# Patient Record
Sex: Male | Born: 1975 | Race: White | Hispanic: No | Marital: Single | Smoking: Former smoker
Health system: Southern US, Community
[De-identification: ages and names within clinical notes are randomized; demographics above are authoritative.]

## PROBLEM LIST (undated history)

## (undated) DIAGNOSIS — F602 Antisocial personality disorder: Secondary | ICD-10-CM

## (undated) DIAGNOSIS — F1290 Cannabis use, unspecified, uncomplicated: Secondary | ICD-10-CM

## (undated) DIAGNOSIS — F4310 Post-traumatic stress disorder, unspecified: Secondary | ICD-10-CM

## (undated) HISTORY — PX: NO PAST SURGERIES: SHX2092

---

## 2016-09-28 ENCOUNTER — Encounter (HOSPITAL_COMMUNITY): Payer: Self-pay | Admitting: *Deleted

## 2016-09-28 DIAGNOSIS — L02415 Cutaneous abscess of right lower limb: Secondary | ICD-10-CM | POA: Insufficient documentation

## 2016-09-28 DIAGNOSIS — Z87891 Personal history of nicotine dependence: Secondary | ICD-10-CM | POA: Insufficient documentation

## 2016-09-28 NOTE — ED Triage Notes (Signed)
Pt c/o red, swollen painful area to back of right leg that started yesterday

## 2016-09-29 ENCOUNTER — Emergency Department (HOSPITAL_COMMUNITY)
Admission: EM | Admit: 2016-09-29 | Discharge: 2016-09-29 | Disposition: A | Discharge status: Home / Self Care | Payer: Self-pay | Attending: Emergency Medicine | Admitting: Emergency Medicine

## 2016-09-29 DIAGNOSIS — L0291 Cutaneous abscess, unspecified: Secondary | ICD-10-CM

## 2016-09-29 HISTORY — DX: Post-traumatic stress disorder, unspecified: F43.10

## 2016-09-29 MED ORDER — LIDOCAINE HCL (PF) 1 % IJ SOLN
INTRAMUSCULAR | Status: AC
  Administered 2016-09-29: 02:00:00
  Filled 2016-09-29: qty 5

## 2016-09-29 MED ORDER — SULFAMETHOXAZOLE-TRIMETHOPRIM 800-160 MG PO TABS: 1.0000 | ORAL_TABLET | Freq: Two times a day (BID) | ORAL | 0 refills | Status: AC

## 2016-09-29 MED ORDER — SULFAMETHOXAZOLE-TRIMETHOPRIM 800-160 MG PO TABS
1.0000 | ORAL_TABLET | Freq: Once | ORAL | Status: AC
  Administered 2016-09-29: 1 via ORAL
  Filled 2016-09-29: qty 1

## 2016-09-29 NOTE — ED Provider Notes (Signed)
[facility]-EM[facility]ENCY DEPT Provider Note   CSN: [id number] Arrival date & time: 09/28/16  2305     History   Chief Complaint Chief Complaint  Patient presents with  . Abscess    HPI Layla MawMichael J Careaga is a 40 y.o. male.  HPI   Layla MawMichael J Worley is a 40 y.o. male who presents to the Emergency Department complaining of painful, red, swollen area to his right inner thigh for one day.  He reports hx of boils and states pain feels similar to previous.  He has tried squeezing the area and war soaks without relief.  Pain is worse with walking.  He denies fever, chills, pain to his groin, abdomen or testicle.     Past Medical History:  Diagnosis Date  . PTSD (post-traumatic stress disorder)     There are no active problems to display for this patient.   History reviewed. No pertinent surgical history.     Home Medications    Prior to Admission medications   Not on File    Family History History reviewed. No pertinent family history.  Social History Social History  Substance Use Topics  . Smoking status: Former Games developermoker  . Smokeless tobacco: Never Used  . Alcohol use No     Allergies   Penicillins   Review of Systems Review of Systems  Constitutional: Negative for chills and fever.  Gastrointestinal: Negative for abdominal pain, nausea and vomiting.  Genitourinary: Negative for dysuria, penile pain, scrotal swelling and testicular pain.  Musculoskeletal: Negative for arthralgias and joint swelling.  Skin: Positive for color change.       Abscess   Hematological: Negative for adenopathy.  All other systems reviewed and are negative.    Physical Exam Updated Vital Signs BP 135/70 (BP Location: Left Arm)   Pulse 62   Temp 98.1 F (36.7 C) (Oral)   Resp 18   Ht 6' (1.829 m)   Wt 127 kg   SpO2 94%   BMI 37.97 kg/m   Physical Exam  Constitutional: He is oriented to person, place, and time. He appears well-developed and well-nourished. No distress.  HENT:    Head: Normocephalic and atraumatic.  Cardiovascular: Normal rate, regular rhythm and normal heart sounds.   No murmur heard. Pulmonary/Chest: Effort normal and breath sounds normal. No respiratory distress.  Abdominal: Soft. He exhibits no distension. There is no tenderness.  Musculoskeletal: Normal range of motion.  Neurological: He is alert and oriented to person, place, and time. He exhibits normal muscle tone. Coordination normal.  Skin: Skin is warm and dry. There is erythema.  Focal tenderness, erythema to the right medial upper thigh with 3 cm area of fluctuance.  No drainage.   Nursing note and vitals reviewed.    ED Treatments / Results  Labs (all labs ordered are listed, but only abnormal results are displayed) Labs Reviewed - No data to display  EKG  EKG Interpretation None       Radiology No results found.  Procedures Procedures (including critical care time)  INCISION AND DRAINAGE Performed by: Maxwell CaulRIPLETT,Zephyr Ridley L. Consent: Verbal consent obtained. Risks and benefits: risks, benefits and alternatives were discussed Type: abscess  Body area: right upper thigh  Anesthesia: local infiltration  Incision was made with a #11 scalpel.  Local anesthetic: lidocaine 1 % w/o epinephrine  Anesthetic total: 3 ml  Complexity: complex Blunt dissection to break up loculations  Drainage: purulent  Drainage amount: moderate  Packing material: 1/4 in iodoform gauze  Patient tolerance: Patient  tolerated the procedure well with no immediate complications.    Medications Ordered in ED Medications  lidocaine (PF) (XYLOCAINE) 1 % injection (not administered)     Initial Impression / Assessment and Plan / ED Course  I have reviewed the triage vital signs and the nursing notes.  Pertinent labs & imaging results that were available during my care of the patient were reviewed by me and considered in my medical decision making (see chart for details).  Clinical  Course     pt with recurrent abscess.  Mild to moderate surrounding cellulitis. NV intact.  Rx for bactrim, pt agrees to warm soaks, ER return if needed.      Final Clinical Impressions(s) / ED Diagnoses   Final diagnoses:  Abscess    New Prescriptions New Prescriptions   SULFAMETHOXAZOLE-TRIMETHOPRIM (BACTRIM DS,SEPTRA DS) 800-160 MG TABLET    Take 1 tablet by mouth 2 (two) times daily.     Pauline Ausammy Brigetta Beckstrom, PA-C 09/29/16 [id number]    Devoria AlbeIva Knapp, MD 09/29/16 [phone]

## 2016-09-29 NOTE — Discharge Instructions (Signed)
Keep the area bandaged.  Packing will need to be removed in 2 days.  Warm water soaks 2-3 times a day until the area heals.  Return here for any worsening symptoms

## 2016-10-05 ENCOUNTER — Emergency Department (HOSPITAL_COMMUNITY)
Admission: EM | Admit: 2016-10-05 | Discharge: 2016-10-05 | Disposition: A | Discharge status: Home / Self Care | Payer: Self-pay | Attending: Emergency Medicine | Admitting: Emergency Medicine

## 2016-10-05 ENCOUNTER — Encounter (HOSPITAL_COMMUNITY): Payer: Self-pay | Admitting: Emergency Medicine

## 2016-10-05 DIAGNOSIS — Z4800 Encounter for change or removal of nonsurgical wound dressing: Secondary | ICD-10-CM | POA: Insufficient documentation

## 2016-10-05 DIAGNOSIS — Z5189 Encounter for other specified aftercare: Secondary | ICD-10-CM

## 2016-10-05 DIAGNOSIS — Z87891 Personal history of nicotine dependence: Secondary | ICD-10-CM | POA: Insufficient documentation

## 2016-10-05 NOTE — ED Notes (Signed)
Pt seen by edp and d/c prior to nursing assesesment.

## 2016-10-05 NOTE — ED Triage Notes (Signed)
Pt reports he has packing stuck in an area on his leg that was packed on the 6th.

## 2016-10-05 NOTE — ED Provider Notes (Signed)
[facility] DEPT Provider Note   CSN: [id number] Arrival date & time: 10/05/16  1325 By signing my name below, I, Bridgette HabermannMaria Tan, attest that this documentation has been prepared under the direction and in the presence of Raeford RazorStephen Raveena Hebdon, MD. Electronically Signed: Bridgette HabermannMaria Tan, ED Scribe. 10/05/16. 2:39 PM.  History   Chief Complaint Chief Complaint  Patient presents with  . Wound Check   HPI Comments: Reginald Thompson is a 40 y.o. male with no pertinent PMHx, who presents to the Emergency Department for a wound check. Pt was seen here on 09/29/16 for an abscess to his right inner thigh. Pt had an I&D done and packing placed. He was prescribed Bactrim and subsequently discharged. Pt has since been compliant with his medication; however, he is concerned that packing is still stuck inside the now closed I&D site.  He denies fever, chills, nausea, vomiting, or any other associated symptoms. Pt is in no pain at this time.   The history is provided by the patient. No language interpreter was used.    Past Medical History:  Diagnosis Date  . PTSD (post-traumatic stress disorder)     There are no active problems to display for this patient.   History reviewed. No pertinent surgical history.     Home Medications    Prior to Admission medications   Medication Sig Start Date End Date Taking? Authorizing Provider  sulfamethoxazole-trimethoprim (BACTRIM DS,SEPTRA DS) 800-160 MG tablet Take 1 tablet by mouth 2 (two) times daily. 09/29/16 10/06/16  Pauline Ausammy Triplett, PA-C    Family History Family History  Problem Relation Age of Onset  . Hypertension Mother   . Diabetes Other   . Cancer Other     Social History Social History  Substance Use Topics  . Smoking status: Former Games developermoker  . Smokeless tobacco: Never Used  . Alcohol use No     Allergies   Penicillins   Review of Systems Review of Systems  Constitutional: Negative for chills and fever.  Gastrointestinal: Negative for  nausea and vomiting.  Skin: Positive for wound.     Physical Exam Updated Vital Signs BP 131/80 (BP Location: Left Arm)   Pulse (!) 55   Temp 97.9 F (36.6 C) (Oral)   Resp 18   Ht 6' (1.829 m)   Wt 280 lb (127 kg)   SpO2 98%   BMI 37.97 kg/m   Physical Exam  Constitutional: He appears well-developed and well-nourished.  HENT:  Head: Normocephalic.  Eyes: Conjunctivae are normal.  Cardiovascular: Normal rate.   Pulmonary/Chest: Effort normal. No respiratory distress.  Abdominal: He exhibits no distension.  Musculoskeletal: Normal range of motion.  Small wound to right medial thigh consistent with recent I&D. No tenderness, no redness, no swelling. Highly doubt packing still remains.  Neurological: He is alert.  Skin: Skin is warm and dry.  Psychiatric: He has a normal mood and affect. His behavior is normal.  Nursing note and vitals reviewed.    ED Treatments / Results  DIAGNOSTIC STUDIES: Oxygen Saturation is 98% on RA, normal by my interpretation.    COORDINATION OF CARE: 2:37 PM Discussed treatment plan with pt at bedside and pt agreed to plan.  Labs (all labs ordered are listed, but only abnormal results are displayed) Labs Reviewed - No data to display  EKG  EKG Interpretation None       Radiology No results found.  Procedures Procedures (including critical care time)  Medications Ordered in ED Medications - No data to display  Initial Impression / Assessment and Plan / ED Course  I have reviewed the triage vital signs and the nursing notes.  Pertinent labs & imaging results that were available during my care of the patient were reviewed by me and considered in my medical decision making (see chart for details).  Clinical Course    40 year old male presenting for wound 37check. He has concern that packing may still be in place. I highly doubt this. Skin is now closed. Do not appreciate a foreign body. The site overall appears to be healing  without obvious complication. I suspect he would have a lot of inflammation  changes he did have packing still in place. Continued wound care and further return precautions for discussed.  Final Clinical Impressions(s) / ED Diagnoses   Final diagnoses:  Visit for wound check    New Prescriptions New Prescriptions   No medications on file   I personally preformed the services scribed in my presence. The recorded information has been reviewed is accurate. Raeford RazorStephen Wells Gerdeman, MD.     Raeford RazorStephen Daimen Shovlin, MD 10/12/16 [phone]

## 2016-10-23 NOTE — Congregational Nurse Program (Signed)
Congregational Nurse Program Note  Date of Encounter: 10/23/2016  Past Medical History: Past Medical History:  Diagnosis Date  . PTSD (post-traumatic stress disorder)     Encounter Details:     CNP Questionnaire - 10/20/16 1413      Patient Demographics   Is this a new or existing patient? New   Patient is considered a/an Not Applicable   Race Caucasian/White     Patient Assistance   [place] of Patient [place]   Patient's financial/insurance status Self-Pay (Uninsured)   Uninsured Patient ([employer] Connects) Yes   Interventions Assisted patient in making appt.   Patient referred to apply for the following financial assistance Not Applicable   Food insecurities addressed Provided food supplies   Transportation assistance No   Assistance securing medications No   Educational health offerings Navigating the healthcare system     Encounter Details   Primary purpose of visit Navigating the Healthcare System   Was an Emergency Department visit averted? No   Does patient have a medical provider? No   Patient referred to Area Agency   Was a mental health screening completed? (GAINS tool) No   Does patient have dental issues? No   Does patient have vision issues? No   Does your patient have an abnormal blood pressure today? No   Since previous encounter, have you referred patient for abnormal blood pressure that resulted in a new diagnosis or medication change? No   Does your patient have an abnormal blood glucose today? No   Since previous encounter, have you referred patient for abnormal blood glucose that resulted in a new diagnosis or medication change? No   Was there a life-saving intervention made? No     [facility] in need of a healthcare provider. Client denies any physical conditions but does state suffers from PTSD, and understands in need of medical treatment for this and to get established with a PCP.Client  was offered information of process to seek help at [facility]. And was also given [facility]. Appointment was completed with [facility] health department 11/02/16 and notified of appointment, has transportion. Vitals today BP- 122/64 P-54.  Leotis ShamesJanice Nehal Shives,RN [phone].

## 2017-01-27 ENCOUNTER — Emergency Department (HOSPITAL_COMMUNITY)
Admission: EM | Admit: 2017-01-27 | Discharge: 2017-01-28 | Disposition: A | Discharge status: Home / Self Care | Payer: Self-pay | Attending: Emergency Medicine | Admitting: Emergency Medicine

## 2017-01-27 ENCOUNTER — Encounter (HOSPITAL_COMMUNITY): Payer: Self-pay | Admitting: Emergency Medicine

## 2017-01-27 DIAGNOSIS — L02416 Cutaneous abscess of left lower limb: Secondary | ICD-10-CM | POA: Insufficient documentation

## 2017-01-27 DIAGNOSIS — Z87891 Personal history of nicotine dependence: Secondary | ICD-10-CM | POA: Insufficient documentation

## 2017-01-27 MED ORDER — MORPHINE SULFATE (PF) 4 MG/ML IV SOLN
4.0000 mg | Freq: Once | INTRAVENOUS | Status: AC
  Administered 2017-01-28: 4 mg via INTRAMUSCULAR
  Filled 2017-01-27: qty 1

## 2017-01-27 MED ORDER — LIDOCAINE-EPINEPHRINE (PF) 2 %-1:200000 IJ SOLN
10.0000 mL | Freq: Once | INTRAMUSCULAR | Status: AC
  Administered 2017-01-28: 10 mL
  Filled 2017-01-27: qty 20

## 2017-01-27 NOTE — ED Provider Notes (Signed)
[facility]-EMERGENCY DEPT Provider Note   CSN: [id number] Arrival date & time: 01/27/17  2048  By signing my name below, I, Cynda Acres, attest that this documentation has been prepared under the direction and in the presence of Devoria Albe, MD. Electronically Signed: Cynda Acres, Scribe. 01/27/17. 11:42 PM.  Time seen 23:30 PM  History   Chief Complaint Chief Complaint  Patient presents with  . Abscess   HPI Comments: Reginald Thompson is a 41 y.o. male with a history of PTSD and recurrent abscesses, who presents to the Emergency Department complaining of sudden-onset, constant worsening pain and swelling to the right inner thigh that began 4 days ago. Patient reports recurrent "boils" to the right inner thigh. Patient was seen here 09/29/16, in which the patient had an abscess in the same area, an I&D procedure was done and antibiotics were prescribed. Patient reports associated subjective fever. No modifying factors indicated. Patient denies having a primary care physician. Patient denies any social history of tobacco or alcohol use. Patient denies any nausea, vomiting, diarrhea, drainage, documented fever, chills, numbness, or tingling.   The history is provided by the patient. No language interpreter was used.   PCP none  Past Medical History:  Diagnosis Date  . PTSD (post-traumatic stress disorder)     There are no active problems to display for this patient.   History reviewed. No pertinent surgical history.     Home Medications    Prior to Admission medications   Medication Sig Start Date End Date Taking? Authorizing Provider  HYDROcodone-acetaminophen (NORCO/VICODIN) 5-325 MG tablet Take 1 tablet by mouth every 6 (six) hours as needed. 01/28/17   Devoria Albe, MD  sulfamethoxazole-trimethoprim (BACTRIM DS,SEPTRA DS) 800-160 MG tablet Take 1 tablet by mouth 2 (two) times daily. 01/28/17   Devoria Albe, MD    Family History Family History  Problem Relation Age of Onset  .  Hypertension Mother   . Diabetes Other   . Cancer Other     Social History Social History  Substance Use Topics  . Smoking status: Former Games developer  . Smokeless tobacco: Never Used  . Alcohol use No  unemployed Applying for disabililty for PTSD and back pain   Allergies   Penicillins   Review of Systems Review of Systems  Constitutional: Positive for fever (subjective).  Gastrointestinal: Negative for diarrhea, nausea and vomiting.  Skin:       Abscess to the right posterior thigh, no drainage.   Neurological: Negative for numbness.  All other systems reviewed and are negative.    Physical Exam Updated Vital Signs BP 120/76 (BP Location: Left Arm)   Pulse 79   Temp 98.6 F (37 C) (Oral)   Resp 18   Ht 6' (1.829 m)   Wt 280 lb (127 kg)   SpO2 98%   BMI 37.97 kg/m   Vital signs normal    Physical Exam  Constitutional: He is oriented to person, place, and time. He appears well-developed and well-nourished.  Non-toxic appearance. He does not appear ill. He appears distressed.  HENT:  Head: Normocephalic and atraumatic.  Right Ear: External ear normal.  Left Ear: External ear normal.  Nose: Nose normal.  Mouth/Throat: Oropharynx is clear and moist and mucous membranes are normal.  Eyes: Conjunctivae and EOM are normal.  Neck: Normal range of motion and full passive range of motion without pain. Neck supple.  Cardiovascular: Normal rate.   Pulmonary/Chest: Effort normal. No respiratory distress. He has no rhonchi. He exhibits no  crepitus.  Abdominal: Normal appearance.  Musculoskeletal: Normal range of motion. He exhibits no edema or tenderness.  2x 3 cm swelling and raised area of abscess with 5cm area of surrounding erythema to the right proximal inner thigh.   Neurological: He is alert and oriented to person, place, and time. He has normal strength. No cranial nerve deficit.  Skin: Skin is warm, dry and intact. No rash noted. There is erythema. No pallor.    Psychiatric: He has a normal mood and affect. His speech is normal and behavior is normal. His mood appears not anxious.  Nursing note and vitals reviewed.    ED Treatments / Results  Labs (all labs ordered are listed, but only abnormal results are displayed) Labs Reviewed - No data to display  EKG  EKG Interpretation None       Radiology No results found.  Procedures .Marland KitchenIncision and Drainage Date/Time: 01/28/2017 12:55 AM Performed by: Lynelle Doctor, Lajoyce Tamura Authorized by: Devoria Albe   Consent:    Consent obtained:  Verbal   Consent given by:  Patient   Risks discussed:  Bleeding and pain   Alternatives discussed:  No treatment Location:    Type:  Abscess   Size:  2x3 cm   Location:  Lower extremity   Lower extremity location:  Leg   Leg location:  R upper leg Pre-procedure details:    Skin preparation:  Betadine Anesthesia (see MAR for exact dosages):    Anesthesia method:  Local infiltration   Local anesthetic:  Lidocaine 2% WITH epi Procedure type:    Complexity:  Complex Procedure details:    Needle aspiration: no     Incision types:  Single straight   Incision depth:  Subcutaneous   Scalpel blade:  11   Wound management:  Probed and deloculated   Drainage:  Purulent and bloody   Drainage amount:  Copious   Wound treatment:  Wound left open   Packing materials:  1/4 in gauze   Amount 1/4":  3 Post-procedure details:    Patient tolerance of procedure:  Tolerated well, no immediate complications Comments:     Pt given Morphine 4 mg IM prior to procedure   (including critical care time)  Medications Ordered in ED Medications  sulfamethoxazole-trimethoprim (BACTRIM DS,SEPTRA DS) 800-160 MG per tablet 1 tablet (not administered)  morphine 4 MG/ML injection 4 mg (4 mg Intramuscular Given 01/28/17 0006)  lidocaine-EPINEPHrine (XYLOCAINE W/EPI) 2 %-1:200000 (PF) injection 10 mL (10 mLs Infiltration Given by Other 01/28/17 0019)     Initial Impression / Assessment and  Plan / ED Course  I have reviewed the triage vital signs and the nursing notes.  Pertinent labs & imaging results that were available during my care of the patient were reviewed by me and considered in my medical decision making (see chart for details).    DIAGNOSTIC STUDIES: Oxygen Saturation is 98% on RA, normal by my interpretation.    COORDINATION OF CARE: 11:41 PM Discussed treatment plan with pt at bedside and pt agreed to plan, which includes and I&D.      Final Clinical Impressions(s) / ED Diagnoses   Final diagnoses:  Abscess of left thigh    New Prescriptions New Prescriptions   HYDROCODONE-ACETAMINOPHEN (NORCO/VICODIN) 5-325 MG TABLET    Take 1 tablet by mouth every 6 (six) hours as needed.   SULFAMETHOXAZOLE-TRIMETHOPRIM (BACTRIM DS,SEPTRA DS) 800-160 MG TABLET    Take 1 tablet by mouth 2 (two) times daily.     Plan discharge  Devoria Albe, MD, FACEP  I personally performed the services described in this documentation, which was scribed in my presence. The recorded information has been reviewed and considered.  Devoria Albe, MD, Concha Pyo, MD 01/28/17 [phone]

## 2017-01-27 NOTE — ED Triage Notes (Signed)
Pt states he has a boil in right groin area for 4 days

## 2017-01-27 NOTE — Congregational Nurse Program (Signed)
Congregational Nurse Program Note  Date of Encounter: 01/20/2017  Past Medical History: Past Medical History:  Diagnosis Date  . PTSD (post-traumatic stress disorder)     Encounter Details:     CNP Questionnaire - 01/20/17 1038      Patient Demographics   Is this a new or existing patient? New   Patient is considered a/an Not Applicable   Race Caucasian/White     Patient Assistance   Location of Patient [place] [facility]   Patient's financial/insurance status Self-Pay (Uninsured)   Uninsured Patient ([employer] officer, trade union) Yes   Patient referred to apply for the following financial assistance Not Applicable   Food insecurities addressed Provided food supplies   Transportation assistance No   Assistance securing medications No   Educational Programmer, systems the healthcare system     Encounter Details   Primary purpose of visit Navigating the Healthcare System   Was an Emergency Department visit averted? No   Does patient have a medical provider? No   Patient referred to Area Agency   Was a mental health screening completed? (GAINS tool) No   Does patient have dental issues? No   Does patient have vision issues? No   Does your patient have an abnormal blood pressure today? No   Since previous encounter, have you referred patient for abnormal blood pressure that resulted in a new diagnosis or medication change? No   Does your patient have an abnormal blood glucose today? No   Since previous encounter, have you referred patient for abnormal blood glucose that resulted in a new diagnosis or medication change? No   Was there a life-saving intervention made? No     Referral for follow-up appointment to [facility] 02/11/17 11:00. Transportation arranged with [facility].  Client notified of appointment.  Hewitt Shorts, RN  [phone].

## 2017-01-28 MED ORDER — DOXYCYCLINE HYCLATE 100 MG PO TABS: 100.0000 mg | ORAL_TABLET | Freq: Once | ORAL | Status: DC

## 2017-01-28 MED ORDER — HYDROCODONE-ACETAMINOPHEN 5-325 MG PO TABS: 1.0000 | ORAL_TABLET | Freq: Four times a day (QID) | ORAL | 0 refills | Status: DC | PRN

## 2017-01-28 MED ORDER — SULFAMETHOXAZOLE-TRIMETHOPRIM 800-160 MG PO TABS
1.0000 | ORAL_TABLET | Freq: Once | ORAL | Status: AC
  Administered 2017-01-28: 1 via ORAL
  Filled 2017-01-28: qty 1

## 2017-01-28 MED ORDER — SULFAMETHOXAZOLE-TRIMETHOPRIM 800-160 MG PO TABS: 1.0000 | ORAL_TABLET | Freq: Two times a day (BID) | ORAL | 0 refills | Status: DC

## 2017-01-28 NOTE — Discharge Instructions (Signed)
Soak in a tub of warm epsom salts for 30 minutes 3 times a day. You can pull out the packing while soaking in a warm tub in about 2 days. Take the antibiotic until gone. You can take the hydrocodone/acetaminophen for pain with ibuprofen 600 mg 4 times a day for pain.   Recheck if the redness and swelling enlarge, you get a high fever, vomiting or seem worse.

## 2017-01-28 NOTE — ED Notes (Signed)
Pt had an abscess on right thigh moderate amount of foul smelling greenish gray puss came out. Pt tolerated the I and D fairly well.

## 2017-02-02 MED FILL — Hydrocodone-Acetaminophen Tab 5-325 MG: ORAL | Qty: 6 | Status: AC

## 2017-04-28 NOTE — Congregational Nurse Program (Signed)
Congregational Nurse Program Note  Date of Encounter: 04/28/2017  Past Medical History: Past Medical History:  Diagnosis Date  . PTSD (post-traumatic stress disorder)     Encounter Details:     CNP Questionnaire - 04/28/17 1140      Patient Demographics   Is this a new or existing patient? Existing   Patient is considered a/an Not Applicable   Race Caucasian/White     Patient Assistance   Location of Patient [place]   Patient's financial/insurance status Self-Pay (Uninsured)   Uninsured Patient ([employer] Card/Care Connects) No   Patient referred to apply for the following financial assistance Not Applicable   Food insecurities addressed Provided food supplies   Transportation assistance No   Assistance securing medications No   Product/process development scientistducational health offerings Navigating the healthcare system;Other     Encounter Details   Primary purpose of visit Navigating the Healthcare System;Spiritual Care/Support Visit   Was an Emergency Department visit averted? Not Applicable   Does patient have a medical provider? Yes   Patient referred to Other   Was a mental health screening completed? (GAINS tool) No   Does patient have dental issues? No   Does patient have vision issues? No   Does your patient have an abnormal blood pressure today? No   Since previous encounter, have you referred patient for abnormal blood pressure that resulted in a new diagnosis or medication change? No   Does your patient have an abnormal blood glucose today? No   Since previous encounter, have you referred patient for abnormal blood glucose that resulted in a new diagnosis or medication change? No   Was there a life-saving intervention made? No    Client was given information to [facility] with [facility] to seek help with mental health issues. [facility], but isn't complying with following up with Mental Health. Client  states diagnosed with PTSD/Anxiety issue. Informed client importance of seeking help and following through with treatment.  Client was given number for Emergency/Crisis Services through [facility].  Pearletha AlfredJan Vincen Bejar,RN  [phone].

## 2019-10-06 ENCOUNTER — Encounter (HOSPITAL_COMMUNITY): Payer: Self-pay | Admitting: Emergency Medicine

## 2019-10-06 ENCOUNTER — Emergency Department (HOSPITAL_COMMUNITY)
Admission: EM | Admit: 2019-10-06 | Discharge: 2019-10-06 | Disposition: A | Discharge status: Home / Self Care | Payer: No Typology Code available for payment source | Attending: Emergency Medicine | Admitting: Emergency Medicine

## 2019-10-06 ENCOUNTER — Emergency Department (HOSPITAL_COMMUNITY): Payer: No Typology Code available for payment source

## 2019-10-06 ENCOUNTER — Other Ambulatory Visit: Payer: Self-pay

## 2019-10-06 DIAGNOSIS — M79671 Pain in right foot: Secondary | ICD-10-CM | POA: Diagnosis not present

## 2019-10-06 DIAGNOSIS — Y999 Unspecified external cause status: Secondary | ICD-10-CM | POA: Insufficient documentation

## 2019-10-06 DIAGNOSIS — Z79899 Other long term (current) drug therapy: Secondary | ICD-10-CM | POA: Diagnosis not present

## 2019-10-06 DIAGNOSIS — Y93I9 Activity, other involving external motion: Secondary | ICD-10-CM | POA: Insufficient documentation

## 2019-10-06 DIAGNOSIS — M25561 Pain in right knee: Secondary | ICD-10-CM | POA: Diagnosis not present

## 2019-10-06 DIAGNOSIS — Z87891 Personal history of nicotine dependence: Secondary | ICD-10-CM | POA: Diagnosis not present

## 2019-10-06 DIAGNOSIS — Y92410 Unspecified street and highway as the place of occurrence of the external cause: Secondary | ICD-10-CM | POA: Insufficient documentation

## 2019-10-06 DIAGNOSIS — M25571 Pain in right ankle and joints of right foot: Secondary | ICD-10-CM | POA: Insufficient documentation

## 2019-10-06 MED ORDER — HYDROCODONE-ACETAMINOPHEN 5-325 MG PO TABS
1.0000 | ORAL_TABLET | Freq: Once | ORAL | Status: AC
  Administered 2019-10-06: 14:00:00 1 via ORAL
  Filled 2019-10-06: qty 1

## 2019-10-06 MED ORDER — METHOCARBAMOL 500 MG PO TABS: 500.0000 mg | ORAL_TABLET | Freq: Two times a day (BID) | ORAL | 0 refills | Status: DC

## 2019-10-06 MED ORDER — MELOXICAM 7.5 MG PO TABS: 7.5000 mg | ORAL_TABLET | Freq: Every day | ORAL | 0 refills | Status: DC

## 2019-10-06 NOTE — ED Notes (Signed)
Patient transported to X-ray 
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
## 2019-10-06 NOTE — Discharge Instructions (Signed)
As discussed, your x-rays were normal with no broken bones. I would use the crutches and boot as needed. I have included a paper on ice therapy. I am sending you home with Robaxin which is a muscle relaxer. It can cause drowsiness, so do not drive or operate machinery while on the medication. I am also sending you home with Meloxicam which is for pain. Do not mix with other over the counter medications. Follow-up with your PCP if you symptoms do not improve within the next week. Return to the ER for new or worsening symptoms.
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
## 2020-09-25 ENCOUNTER — Other Ambulatory Visit: Payer: Self-pay

## 2020-09-25 ENCOUNTER — Encounter (HOSPITAL_COMMUNITY): Payer: Self-pay

## 2020-09-25 ENCOUNTER — Emergency Department (HOSPITAL_COMMUNITY): Payer: No Typology Code available for payment source

## 2020-09-25 ENCOUNTER — Emergency Department (HOSPITAL_COMMUNITY)
Admission: EM | Admit: 2020-09-25 | Discharge: 2020-09-25 | Disposition: A | Discharge status: Home / Self Care | Payer: No Typology Code available for payment source | Attending: Emergency Medicine | Admitting: Emergency Medicine

## 2020-09-25 DIAGNOSIS — Z23 Encounter for immunization: Secondary | ICD-10-CM | POA: Diagnosis not present

## 2020-09-25 DIAGNOSIS — Z87891 Personal history of nicotine dependence: Secondary | ICD-10-CM | POA: Insufficient documentation

## 2020-09-25 DIAGNOSIS — S20211A Contusion of right front wall of thorax, initial encounter: Secondary | ICD-10-CM | POA: Diagnosis not present

## 2020-09-25 DIAGNOSIS — Y92410 Unspecified street and highway as the place of occurrence of the external cause: Secondary | ICD-10-CM | POA: Diagnosis not present

## 2020-09-25 DIAGNOSIS — S6991XA Unspecified injury of right wrist, hand and finger(s), initial encounter: Secondary | ICD-10-CM | POA: Diagnosis present

## 2020-09-25 DIAGNOSIS — S62245A Nondisplaced fracture of shaft of first metacarpal bone, left hand, initial encounter for closed fracture: Secondary | ICD-10-CM | POA: Diagnosis not present

## 2020-09-25 DIAGNOSIS — S62244A Nondisplaced fracture of shaft of first metacarpal bone, right hand, initial encounter for closed fracture: Secondary | ICD-10-CM

## 2020-09-25 MED ORDER — TETANUS-DIPHTH-ACELL PERTUSSIS 5-2.5-18.5 LF-MCG/0.5 IM SUSY
0.5000 mL | PREFILLED_SYRINGE | Freq: Once | INTRAMUSCULAR | Status: AC
  Administered 2020-09-25: 0.5 mL via INTRAMUSCULAR
  Filled 2020-09-25: qty 0.5

## 2020-09-25 MED ORDER — LIDOCAINE HCL URETHRAL/MUCOSAL 2 % EX GEL
1.0000 "application " | Freq: Once | CUTANEOUS | Status: DC
  Filled 2020-09-25: qty 10

## 2020-09-25 MED ORDER — HYDROCODONE-ACETAMINOPHEN 5-325 MG PO TABS
1.0000 | ORAL_TABLET | Freq: Once | ORAL | Status: AC
Start: 2020-09-25 — End: 2020-09-25
  Administered 2020-09-25: 1 via ORAL
  Filled 2020-09-25: qty 1

## 2020-09-25 MED ORDER — IBUPROFEN 600 MG PO TABS: 600.0000 mg | ORAL_TABLET | Freq: Four times a day (QID) | ORAL | 0 refills | Status: DC | PRN

## 2020-09-25 MED ORDER — HYDROCODONE-ACETAMINOPHEN 5-325 MG PO TABS
1.0000 | ORAL_TABLET | ORAL | 0 refills | Status: DC | PRN
Start: 2020-09-25 — End: 2020-09-30

## 2020-09-25 MED ORDER — IBUPROFEN 800 MG PO TABS
800.0000 mg | ORAL_TABLET | Freq: Once | ORAL | Status: AC
  Administered 2020-09-25: 800 mg via ORAL
  Filled 2020-09-25: qty 1
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
## 2020-09-25 NOTE — ED Notes (Signed)
Pt gave his wallet to visitor in the room Myriam Jacobson).
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
## 2020-09-27 ENCOUNTER — Telehealth (HOSPITAL_COMMUNITY): Payer: Self-pay | Admitting: Emergency Medicine

## 2020-09-27 MED ORDER — HYDROCODONE-ACETAMINOPHEN 5-325 MG PO TABS
1.0000 | ORAL_TABLET | Freq: Four times a day (QID) | ORAL | 0 refills | Status: DC | PRN
Start: 2020-09-27 — End: 2020-09-30

## 2020-09-27 MED ORDER — IBUPROFEN 600 MG PO TABS: 600.0000 mg | ORAL_TABLET | Freq: Four times a day (QID) | ORAL | 0 refills | Status: DC | PRN

## 2020-09-27 NOTE — Telephone Encounter (Signed)
Prescription called into [facility] as [facility] refused to fill prescription for patient

## 2020-09-30 ENCOUNTER — Ambulatory Visit (INDEPENDENT_AMBULATORY_CARE_PROVIDER_SITE_OTHER): Payer: Self-pay | Admitting: Orthopedic Surgery

## 2020-09-30 ENCOUNTER — Other Ambulatory Visit: Payer: Self-pay

## 2020-09-30 ENCOUNTER — Encounter: Payer: Self-pay | Admitting: Orthopedic Surgery

## 2020-09-30 VITALS — BP 143/73 | HR 61 | Ht 72.0 in | Wt 304.0 lb

## 2020-09-30 DIAGNOSIS — S62231A Other displaced fracture of base of first metacarpal bone, right hand, initial encounter for closed fracture: Secondary | ICD-10-CM
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
## 2020-10-07 ENCOUNTER — Encounter (HOSPITAL_BASED_OUTPATIENT_CLINIC_OR_DEPARTMENT_OTHER): Payer: Self-pay | Admitting: Orthopedic Surgery

## 2020-10-07 ENCOUNTER — Other Ambulatory Visit: Payer: Self-pay

## 2020-10-07 ENCOUNTER — Other Ambulatory Visit: Payer: Self-pay | Admitting: Orthopedic Surgery

## 2020-10-09 ENCOUNTER — Other Ambulatory Visit (HOSPITAL_COMMUNITY)
Admission: RE | Admit: 2020-10-09 | Discharge: 2020-10-09 | Disposition: A | Discharge status: Home / Self Care | Payer: Self-pay | Source: Ambulatory Visit | Attending: Orthopedic Surgery | Admitting: Orthopedic Surgery

## 2020-10-09 DIAGNOSIS — Z20822 Contact with and (suspected) exposure to covid-19: Secondary | ICD-10-CM | POA: Insufficient documentation

## 2020-10-09 DIAGNOSIS — Z01812 Encounter for preprocedural laboratory examination: Secondary | ICD-10-CM | POA: Insufficient documentation

## 2020-10-09 LAB — SARS CORONAVIRUS 2 (TAT 6-24 HRS): SARS Coronavirus 2: NEGATIVE

## 2020-10-13 ENCOUNTER — Ambulatory Visit (HOSPITAL_BASED_OUTPATIENT_CLINIC_OR_DEPARTMENT_OTHER)
Admission: RE | Admit: 2020-10-13 | Discharge: 2020-10-13 | Disposition: A | Discharge status: Home / Self Care | Payer: No Typology Code available for payment source | Attending: Orthopedic Surgery | Admitting: Orthopedic Surgery

## 2020-10-13 ENCOUNTER — Encounter (HOSPITAL_BASED_OUTPATIENT_CLINIC_OR_DEPARTMENT_OTHER): Payer: Self-pay | Admitting: Orthopedic Surgery

## 2020-10-13 ENCOUNTER — Ambulatory Visit (HOSPITAL_BASED_OUTPATIENT_CLINIC_OR_DEPARTMENT_OTHER): Payer: No Typology Code available for payment source | Admitting: Anesthesiology

## 2020-10-13 ENCOUNTER — Encounter (HOSPITAL_BASED_OUTPATIENT_CLINIC_OR_DEPARTMENT_OTHER)
Admission: RE | Disposition: A | Discharge status: Home / Self Care | Payer: Self-pay | Source: Home / Self Care | Attending: Orthopedic Surgery

## 2020-10-13 ENCOUNTER — Other Ambulatory Visit: Payer: Self-pay

## 2020-10-13 DIAGNOSIS — Z87891 Personal history of nicotine dependence: Secondary | ICD-10-CM | POA: Diagnosis not present

## 2020-10-13 DIAGNOSIS — S62231A Other displaced fracture of base of first metacarpal bone, right hand, initial encounter for closed fracture: Secondary | ICD-10-CM | POA: Diagnosis not present

## 2020-10-13 HISTORY — PX: CLOSED REDUCTION METACARPAL WITH PERCUTANEOUS PINNING: SHX5613

## 2020-10-13 SURGERY — CLOSED REDUCTION, FRACTURE, METACARPAL BONE, WITH PERCUTANEOUS PINNING
Anesthesia: General | Site: Finger | Laterality: Right

## 2020-10-13 MED ORDER — FENTANYL CITRATE (PF) 100 MCG/2ML IJ SOLN
25.0000 ug | INTRAMUSCULAR | Status: DC | PRN
  Administered 2020-10-13 (×2): 50 ug via INTRAVENOUS

## 2020-10-13 MED ORDER — ONDANSETRON HCL 4 MG/2ML IJ SOLN: 4.0000 mg | Freq: Once | INTRAMUSCULAR | Status: DC | PRN

## 2020-10-13 MED ORDER — LIDOCAINE 2% (20 MG/ML) 5 ML SYRINGE
INTRAMUSCULAR | Status: DC | PRN
  Administered 2020-10-13: 100 mg via INTRAVENOUS

## 2020-10-13 MED ORDER — ONDANSETRON HCL 4 MG/2ML IJ SOLN
INTRAMUSCULAR | Status: DC | PRN
  Administered 2020-10-13: 4 mg via INTRAVENOUS

## 2020-10-13 MED ORDER — FENTANYL CITRATE (PF) 100 MCG/2ML IJ SOLN
INTRAMUSCULAR | Status: AC
  Filled 2020-10-13: qty 2

## 2020-10-13 MED ORDER — MIDAZOLAM HCL 5 MG/5ML IJ SOLN
INTRAMUSCULAR | Status: DC | PRN
  Administered 2020-10-13: 2 mg via INTRAVENOUS

## 2020-10-13 MED ORDER — CLINDAMYCIN PHOSPHATE 900 MG/50ML IV SOLN
900.0000 mg | INTRAVENOUS | Status: AC
  Administered 2020-10-13: 14:00:00 900 mg via INTRAVENOUS

## 2020-10-13 MED ORDER — LACTATED RINGERS IV SOLN: INTRAVENOUS | Status: DC

## 2020-10-13 MED ORDER — HYDROCODONE-ACETAMINOPHEN 5-325 MG PO TABS: ORAL_TABLET | ORAL | 0 refills | Status: DC

## 2020-10-13 MED ORDER — OXYCODONE HCL 5 MG/5ML PO SOLN: 5.0000 mg | Freq: Once | ORAL | Status: AC | PRN

## 2020-10-13 MED ORDER — BUPIVACAINE HCL 0.25 % IJ SOLN
INTRAMUSCULAR | Status: DC | PRN
  Administered 2020-10-13: 10 mL

## 2020-10-13 MED ORDER — OXYCODONE HCL 5 MG PO TABS
ORAL_TABLET | ORAL | Status: AC
  Filled 2020-10-13: qty 1

## 2020-10-13 MED ORDER — FENTANYL CITRATE (PF) 250 MCG/5ML IJ SOLN
INTRAMUSCULAR | Status: DC | PRN
  Administered 2020-10-13: 50 ug via INTRAVENOUS
  Administered 2020-10-13 (×2): 25 ug via INTRAVENOUS

## 2020-10-13 MED ORDER — DEXMEDETOMIDINE (PRECEDEX) IN NS 20 MCG/5ML (4 MCG/ML) IV SYRINGE
PREFILLED_SYRINGE | INTRAVENOUS | Status: DC | PRN
  Administered 2020-10-13 (×2): 4 ug via INTRAVENOUS
  Administered 2020-10-13: 8 ug via INTRAVENOUS
  Administered 2020-10-13: 4 ug via INTRAVENOUS

## 2020-10-13 MED ORDER — CLINDAMYCIN PHOSPHATE 900 MG/50ML IV SOLN
INTRAVENOUS | Status: AC
  Filled 2020-10-13: qty 50

## 2020-10-13 MED ORDER — DEXAMETHASONE SODIUM PHOSPHATE 10 MG/ML IJ SOLN
INTRAMUSCULAR | Status: DC | PRN
  Administered 2020-10-13: 10 mg via INTRAVENOUS

## 2020-10-13 MED ORDER — PROPOFOL 10 MG/ML IV BOLUS
INTRAVENOUS | Status: AC
  Filled 2020-10-13: qty 20

## 2020-10-13 MED ORDER — OXYCODONE HCL 5 MG PO TABS
5.0000 mg | ORAL_TABLET | Freq: Once | ORAL | Status: AC | PRN
  Administered 2020-10-13: 5 mg via ORAL

## 2020-10-13 MED ORDER — MIDAZOLAM HCL 2 MG/2ML IJ SOLN
INTRAMUSCULAR | Status: AC
  Filled 2020-10-13: qty 2

## 2020-10-13 MED ORDER — PROPOFOL 10 MG/ML IV BOLUS
INTRAVENOUS | Status: DC | PRN
  Administered 2020-10-13: 200 mg via INTRAVENOUS

## 2020-10-13 SURGICAL SUPPLY — 52 items
APL PRP STRL LF DISP 70% ISPRP (MISCELLANEOUS) ×1
BLADE MINI RND TIP GREEN BEAV (BLADE) IMPLANT
BLADE SURG 15 STRL LF DISP TIS (BLADE) ×1 IMPLANT
BLADE SURG 15 STRL SS (BLADE) ×3
BNDG CMPR 9X4 STRL LF SNTH (GAUZE/BANDAGES/DRESSINGS) ×1
BNDG ELASTIC 2X5.8 VLCR STR LF (GAUZE/BANDAGES/DRESSINGS) IMPLANT
BNDG ELASTIC 3X5.8 VLCR STR LF (GAUZE/BANDAGES/DRESSINGS) ×3 IMPLANT
BNDG ESMARK 4X9 LF (GAUZE/BANDAGES/DRESSINGS) ×3 IMPLANT
BNDG GAUZE ELAST 4 BULKY (GAUZE/BANDAGES/DRESSINGS) ×3 IMPLANT
CHLORAPREP W/TINT 26 (MISCELLANEOUS) ×3 IMPLANT
CORD BIPOLAR FORCEPS 12FT (ELECTRODE) ×3 IMPLANT
COVER BACK TABLE 60X90IN (DRAPES) ×3 IMPLANT
COVER MAYO STAND STRL (DRAPES) ×3 IMPLANT
COVER WAND RF STERILE (DRAPES) IMPLANT
CUFF TOURN SGL QUICK 18X4 (TOURNIQUET CUFF) ×3 IMPLANT
DRAPE EXTREMITY T 121X128X90 (DISPOSABLE) ×3 IMPLANT
DRAPE OEC MINIVIEW 54X84 (DRAPES) ×3 IMPLANT
DRAPE SURG 17X23 STRL (DRAPES) ×3 IMPLANT
GAUZE SPONGE 4X4 12PLY STRL (GAUZE/BANDAGES/DRESSINGS) ×3 IMPLANT
GAUZE XEROFORM 1X8 LF (GAUZE/BANDAGES/DRESSINGS) ×3 IMPLANT
GLOVE BIO SURGEON STRL SZ7.5 (GLOVE) ×3 IMPLANT
GLOVE SRG 8 PF TXTR STRL LF DI (GLOVE) ×1 IMPLANT
GLOVE SURG UNDER POLY LF SZ8 (GLOVE) ×3
GOWN STRL REUS W/ TWL LRG LVL3 (GOWN DISPOSABLE) ×1 IMPLANT
GOWN STRL REUS W/TWL LRG LVL3 (GOWN DISPOSABLE) ×3
GOWN STRL REUS W/TWL XL LVL3 (GOWN DISPOSABLE) ×3 IMPLANT
NEEDLE HYPO 22GX1.5 SAFETY (NEEDLE) IMPLANT
NEEDLE HYPO 25X1 1.5 SAFETY (NEEDLE) ×3 IMPLANT
NS IRRIG 1000ML POUR BTL (IV SOLUTION) ×3 IMPLANT
PACK BASIN DAY SURGERY FS (CUSTOM PROCEDURE TRAY) ×3 IMPLANT
PAD CAST 3X4 CTTN HI CHSV (CAST SUPPLIES) ×1 IMPLANT
PAD CAST 4YDX4 CTTN HI CHSV (CAST SUPPLIES) IMPLANT
PADDING CAST ABS 4INX4YD NS (CAST SUPPLIES) ×2
PADDING CAST ABS COTTON 4X4 ST (CAST SUPPLIES) ×1 IMPLANT
PADDING CAST COTTON 3X4 STRL (CAST SUPPLIES) ×3
PADDING CAST COTTON 4X4 STRL (CAST SUPPLIES)
SLEEVE SCD COMPRESS KNEE MED (MISCELLANEOUS) ×3 IMPLANT
SPLINT PLASTER CAST XFAST 3X15 (CAST SUPPLIES) ×5 IMPLANT
SPLINT PLASTER CAST XFAST 4X15 (CAST SUPPLIES) IMPLANT
SPLINT PLASTER XTRA FAST SET 4 (CAST SUPPLIES)
SPLINT PLASTER XTRA FASTSET 3X (CAST SUPPLIES) ×10
STOCKINETTE 4X48 STRL (DRAPES) ×3 IMPLANT
SUT ETHILON 3 0 PS 1 (SUTURE) IMPLANT
SUT ETHILON 4 0 PS 2 18 (SUTURE) ×3 IMPLANT
SUT MERSILENE 4 0 P 3 (SUTURE) IMPLANT
SUT VIC AB 3-0 PS1 18 (SUTURE)
SUT VIC AB 3-0 PS1 18XBRD (SUTURE) IMPLANT
SUT VICRYL 4-0 PS2 18IN ABS (SUTURE) IMPLANT
SYR BULB EAR ULCER 3OZ GRN STR (SYRINGE) ×3 IMPLANT
SYR CONTROL 10ML LL (SYRINGE) IMPLANT
TOWEL GREEN STERILE FF (TOWEL DISPOSABLE) ×6 IMPLANT
UNDERPAD 30X36 HEAVY ABSORB (UNDERPADS AND DIAPERS) ×3 IMPLANT

## 2020-10-13 NOTE — Discharge Instructions (Addendum)

## 2020-10-13 NOTE — Anesthesia Procedure Notes (Addendum)
Procedure Name: LMA Insertion Date/Time: 10/13/2020 1:40 PM Performed by: Lucinda Dell, CRNA Pre-anesthesia Checklist: Patient identified, Emergency Drugs available, Suction available and Patient being monitored Patient Re-evaluated:Patient Re-evaluated prior to induction Oxygen Delivery Method: Circle system utilized Preoxygenation: Pre-oxygenation with 100% oxygen Induction Type: IV induction Ventilation: Mask ventilation without difficulty LMA: LMA inserted LMA Size: 5.0 Tube type: Oral Number of attempts: 1 Placement Confirmation: positive ETCO2 and breath sounds checked- equal and bilateral Tube secured with: Tape Dental Injury: Teeth and Oropharynx as per pre-operative assessment

## 2020-10-13 NOTE — Transfer of Care (Signed)
Immediate Anesthesia Transfer of Care Note  Patient: Reginald Thompson  Procedure(s) Performed: CLOSED REDUCTION METACARPAL WITH PERCUTANEOUS PINNING (Right Finger)  Patient Location: PACU  Anesthesia Type:General  Level of Consciousness: awake, alert , oriented and patient cooperative  Airway & Oxygen Therapy: Patient Spontanous Breathing and Patient connected to face mask oxygen  Post-op Assessment: Report given to RN, Post -op Vital signs reviewed and stable and Patient moving all extremities  Post vital signs: Reviewed and stable  Last Vitals:  Vitals Value Taken Time  BP    Temp    Pulse 62 10/13/20 1429  Resp 20 10/13/20 1429  SpO2 100 % 10/13/20 1429  Vitals shown include unvalidated device data.  Last Pain:  Vitals:   10/13/20 1143  TempSrc: Oral  PainSc: 7       Patients Stated Pain Goal: 3 (10/13/20 1143)  Complications: No complications documented.
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
## 2020-10-13 NOTE — Anesthesia Preprocedure Evaluation (Addendum)
Anesthesia Evaluation  Patient identified by MRN, date of birth, ID band Patient awake    Reviewed: Allergy & Precautions, NPO status , Patient's Chart, lab work & pertinent test results  Airway Mallampati: I  TM Distance: >3 FB Neck ROM: Full    Dental no notable dental hx. (+) Teeth Intact   Pulmonary former smoker,    Pulmonary exam normal breath sounds clear to auscultation       Cardiovascular negative cardio ROS Normal cardiovascular exam Rhythm:Regular Rate:Normal     Neuro/Psych PSYCHIATRIC DISORDERS Anxiety PTSDnegative neurological ROS     GI/Hepatic negative GI ROS, Neg liver ROS,   Endo/Other  Morbid obesity  Renal/GU negative Renal ROS  negative genitourinary   Musculoskeletal Fx right 1st metacarpal   Abdominal (+) + obese,   Peds  Hematology negative hematology ROS (+)   Anesthesia Other Findings   Reproductive/Obstetrics                            Anesthesia Physical Anesthesia Plan  ASA: III  Anesthesia Plan: General   Post-op Pain Management:    Induction: Intravenous  PONV Risk Score and Plan: 3 and Treatment may vary due to age or medical condition and Ondansetron  Airway Management Planned: LMA  Additional Equipment:   Intra-op Plan:   Post-operative Plan: Extubation in OR  Informed Consent: I have reviewed the patients History and Physical, chart, labs and discussed the procedure including the risks, benefits and alternatives for the proposed anesthesia with the patient or authorized representative who has indicated his/her understanding and acceptance.     Dental advisory given  Plan Discussed with: CRNA and Anesthesiologist  Anesthesia Plan Comments:         Anesthesia Quick Evaluation

## 2020-10-13 NOTE — Anesthesia Postprocedure Evaluation (Signed)
Anesthesia Post Note  Patient: Reginald Thompson  Procedure(s) Performed: CLOSED REDUCTION METACARPAL WITH PERCUTANEOUS PINNING (Right Finger)     Patient location during evaluation: PACU Anesthesia Type: General Level of consciousness: awake and alert and oriented Pain management: pain level controlled Vital Signs Assessment: post-procedure vital signs reviewed and stable Respiratory status: spontaneous breathing, nonlabored ventilation and respiratory function stable Cardiovascular status: blood pressure returned to baseline and stable Postop Assessment: no apparent nausea or vomiting Anesthetic complications: no   No complications documented.  Last Vitals:  Vitals:   10/13/20 1430 10/13/20 1445  BP: 140/80 (!) 140/99  Pulse: 61 (!) 56  Resp: 15 10  Temp: 36.5 C   SpO2: 100% 99%    Last Pain:  Vitals:   10/13/20 1445  TempSrc:   PainSc: 5                  Elton Heid,Riaan A.

## 2020-10-15 ENCOUNTER — Encounter (HOSPITAL_BASED_OUTPATIENT_CLINIC_OR_DEPARTMENT_OTHER): Payer: Self-pay | Admitting: Orthopedic Surgery
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
## 2022-09-15 ENCOUNTER — Emergency Department (HOSPITAL_COMMUNITY)
Admission: EM | Admit: 2022-09-15 | Discharge: 2022-09-15 | Disposition: A | Discharge status: Home / Self Care | Payer: Self-pay | Attending: Emergency Medicine | Admitting: Emergency Medicine

## 2022-09-15 ENCOUNTER — Encounter (HOSPITAL_COMMUNITY): Payer: Self-pay

## 2022-09-15 ENCOUNTER — Other Ambulatory Visit: Payer: Self-pay

## 2022-09-15 ENCOUNTER — Emergency Department (HOSPITAL_COMMUNITY): Payer: Self-pay

## 2022-09-15 DIAGNOSIS — R002 Palpitations: Secondary | ICD-10-CM | POA: Insufficient documentation

## 2022-09-15 DIAGNOSIS — R0789 Other chest pain: Secondary | ICD-10-CM | POA: Insufficient documentation

## 2022-09-15 DIAGNOSIS — F17210 Nicotine dependence, cigarettes, uncomplicated: Secondary | ICD-10-CM | POA: Insufficient documentation

## 2022-09-15 DIAGNOSIS — I10 Essential (primary) hypertension: Secondary | ICD-10-CM | POA: Insufficient documentation

## 2022-09-15 LAB — CBC
HCT: 37.8 % — ABNORMAL LOW (ref 39.0–52.0)
Hemoglobin: 12.6 g/dL — ABNORMAL LOW (ref 13.0–17.0)
MCH: 30.8 pg (ref 26.0–34.0)
MCHC: 33.3 g/dL (ref 30.0–36.0)
MCV: 92.4 fL (ref 80.0–100.0)
Platelets: 277 10*3/uL (ref 150–400)
RBC: 4.09 MIL/uL — ABNORMAL LOW (ref 4.22–5.81)
RDW: 13.8 % (ref 11.5–15.5)
WBC: 7.4 10*3/uL (ref 4.0–10.5)
nRBC: 0 % (ref 0.0–0.2)

## 2022-09-15 LAB — BASIC METABOLIC PANEL
Anion gap: 6 (ref 5–15)
BUN: 15 mg/dL (ref 6–20)
CO2: 23 mmol/L (ref 22–32)
Calcium: 8.8 mg/dL — ABNORMAL LOW (ref 8.9–10.3)
Chloride: 106 mmol/L (ref 98–111)
Creatinine, Ser: 1.06 mg/dL (ref 0.61–1.24)
GFR, Estimated: >60 mL/min (ref >60)
Glucose, Bld: 96 mg/dL (ref 70–99)
Potassium: 3.9 mmol/L (ref 3.5–5.1)
Sodium: 135 mmol/L (ref 135–145)

## 2022-09-15 LAB — TROPONIN I (HIGH SENSITIVITY)
Troponin I (High Sensitivity): 5 ng/L (ref <18)
Troponin I (High Sensitivity): 5 ng/L (ref <18)

## 2022-09-15 LAB — CBG MONITORING, ED: Glucose-Capillary: 93 mg/dL (ref 70–99)

## 2022-09-15 MED ORDER — ASPIRIN 81 MG PO CHEW
324.0000 mg | CHEWABLE_TABLET | Freq: Once | ORAL | Status: AC
  Administered 2022-09-15: 324 mg via ORAL
  Filled 2022-09-15: qty 4
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
## 2022-09-15 NOTE — Discharge Instructions (Signed)
Please take a baby aspirin every day until you see your doctor in follow-up.  Return to the emergency department for severe or worsening symptoms.  Rest assured that all of your testing was normal today showing no signs of heart attack

## 2022-09-15 NOTE — ED Provider Notes (Signed)
[facility] EMERGENCY DEPARTMENT Provider Note   CSN: [id number] Arrival date & time: 09/15/22  1637     History  Chief Complaint  Patient presents with   Chest Pain    GLENN GULLICKSON is a 46 y.o. male.   Chest Pain  This patient is a 46 year old male who denies any chronic medical conditions including hypertension diabetes or high cholesterol.  He does not smoke cigarettes or drink but he does smoke a large amount of marijuana for quite some time.  He reports having no prior history of cardiac disease.  He states that today while he was watching his significant other cook he developed acute onset of a palpitation in his chest with a sharp pain.  This has been constant for the last couple of hours, seems to radiate into his left arm and is not associated with nausea shortness of breath or swelling of the legs.  He does not have any swelling, no recent travel trauma immobilization or surgery, denies history of DVT, has no history of cardiac disease or pulmonary disease that he knows of.  He has had no medications prior to arrival.    Home Medications Prior to Admission medications   Medication Sig Start Date End Date Taking? Authorizing Provider  HYDROcodone-acetaminophen (NORCO) 5-325 MG tablet 1-2 tabs po q6 hours prn pain 10/13/20   Betha Loa, MD  ibuprofen (ADVIL) 600 MG tablet Take 1 tablet (600 mg total) by mouth every 6 (six) hours as needed. 09/25/20   Jacalyn Lefevre, MD      Allergies    Penicillins    Review of Systems   Review of Systems  Cardiovascular:  Positive for chest pain.  All other systems reviewed and are negative.   Physical Exam Updated Vital Signs BP 116/62   Pulse 63   Temp 97.7 F (36.5 C) (Oral)   Resp 18   Ht 1.854 m (6\' 1" )   Wt 125.2 kg   SpO2 94%   BMI 36.41 kg/m  Physical Exam Vitals and nursing note reviewed.  Constitutional:      General: He is not in acute distress.    Appearance: He is well-developed.  HENT:     Head:  Normocephalic and atraumatic.     Mouth/Throat:     Pharynx: No oropharyngeal exudate.  Eyes:     General: No scleral icterus.       Right eye: No discharge.        Left eye: No discharge.     Conjunctiva/sclera: Conjunctivae normal.     Pupils: Pupils are equal, round, and reactive to light.  Neck:     Thyroid: No thyromegaly.     Vascular: No JVD.  Cardiovascular:     Rate and Rhythm: Normal rate and regular rhythm.     Heart sounds: Normal heart sounds. No murmur heard.    No friction rub. No gallop.  Pulmonary:     Effort: Pulmonary effort is normal. No respiratory distress.     Breath sounds: Normal breath sounds. No wheezing or rales.  Abdominal:     General: Bowel sounds are normal. There is no distension.     Palpations: Abdomen is soft. There is no mass.     Tenderness: There is no abdominal tenderness.  Musculoskeletal:        General: No tenderness. Normal range of motion.     Cervical back: Normal range of motion and neck supple.  Lymphadenopathy:     Cervical: No cervical  adenopathy.  Skin:    General: Skin is warm and dry.     Findings: No erythema or rash.  Neurological:     Mental Status: He is alert.     Coordination: Coordination normal.  Psychiatric:        Behavior: Behavior normal.     ED Results / Procedures / Treatments   Labs (all labs ordered are listed, but only abnormal results are displayed) Labs Reviewed  BASIC METABOLIC PANEL - Abnormal; Notable for the following components:      Result Value   Calcium 8.8 (*)    All other components within normal limits  CBC - Abnormal; Notable for the following components:   RBC 4.09 (*)    Hemoglobin 12.6 (*)    HCT 37.8 (*)    All other components within normal limits  CBG MONITORING, ED  TROPONIN I (HIGH SENSITIVITY)  TROPONIN I (HIGH SENSITIVITY)    EKG EKG Interpretation  Date/Time:  Wednesday September 15 2022 16:57:38 EST Ventricular Rate:  61 PR Interval:  209 QRS Duration: 96 QT  Interval:  427 QTC Calculation: 431 R Axis:   60 Text Interpretation: Sinus rhythm Borderline prolonged PR interval Normal ECG Confirmed by Eber Hong ([id number]) on 09/15/2022 5:05:16 PM  Radiology DG Chest Portable 1 View  Result Date: 09/15/2022 CLINICAL DATA:  Mid chest pain radiating to the back and left arm. Numbness and tingling in the left arm. Headache and nausea. EXAM: PORTABLE CHEST 1 VIEW COMPARISON:  09/25/2020 FINDINGS: The heart size and mediastinal contours are within normal limits. Both lungs are clear. The visualized skeletal structures are unremarkable. IMPRESSION: No active disease. Electronically Signed   By: Burman Nieves M.D.   On: 09/15/2022 17:30    Procedures Procedures    Medications Ordered in ED Medications  aspirin chewable tablet 324 mg (324 mg Oral Given 09/15/22 1919)    ED Course/ Medical Decision Making/ A&P                           Medical Decision Making Amount and/or Complexity of Data Reviewed Labs: ordered. Radiology: ordered.  Risk OTC drugs.   The patient's exam is unremarkable, his vital signs are unremarkable, he is not tachycardic hypotensive hypertensive hypoxic or tachypneic.  He is not febrile.  His lung sounds and heart sounds are unremarkable.  I doubt that he has a pulmonary embolism given no edema no tachycardia no hypoxia and no shortness of breath.  This may very well just be some pleurisy.  We will get an EKG chest x-ray and troponins.  The patient is agreeable.  Imaging: No signs of abnormality on the x-ray  Labs: 2 negative troponins suggesting no signs of acute cardiac illness  Cardiac monitoring: Normal sinus rhythm, EKG reassuring  Medication management: Recommended anti-inflammatories for home, stable for discharge, patient understanding and agreeable        Final Clinical Impression(s) / ED Diagnoses Final diagnoses:  Atypical chest pain    Rx / DC Orders ED Discharge Orders     None          Eber Hong, MD 09/15/22 2007

## 2022-09-24 DIAGNOSIS — Z419 Encounter for procedure for purposes other than remedying health state, unspecified: Secondary | ICD-10-CM | POA: Diagnosis not present

## 2022-10-05 DIAGNOSIS — F4325 Adjustment disorder with mixed disturbance of emotions and conduct: Secondary | ICD-10-CM | POA: Diagnosis not present

## 2022-10-12 DIAGNOSIS — M5440 Lumbago with sciatica, unspecified side: Secondary | ICD-10-CM | POA: Diagnosis not present

## 2022-10-12 DIAGNOSIS — Z0001 Encounter for general adult medical examination with abnormal findings: Secondary | ICD-10-CM | POA: Diagnosis not present

## 2022-10-12 DIAGNOSIS — R8289 Other abnormal findings on cytological and histological examination of urine: Secondary | ICD-10-CM | POA: Diagnosis not present

## 2022-10-12 DIAGNOSIS — Z1329 Encounter for screening for other suspected endocrine disorder: Secondary | ICD-10-CM | POA: Diagnosis not present

## 2022-10-13 DIAGNOSIS — F329 Major depressive disorder, single episode, unspecified: Secondary | ICD-10-CM | POA: Diagnosis not present

## 2022-10-22 DIAGNOSIS — F4325 Adjustment disorder with mixed disturbance of emotions and conduct: Secondary | ICD-10-CM | POA: Diagnosis not present

## 2022-10-25 DIAGNOSIS — Z419 Encounter for procedure for purposes other than remedying health state, unspecified: Secondary | ICD-10-CM | POA: Diagnosis not present

## 2022-11-02 DIAGNOSIS — F4325 Adjustment disorder with mixed disturbance of emotions and conduct: Secondary | ICD-10-CM | POA: Diagnosis not present

## 2022-11-15 DIAGNOSIS — F4325 Adjustment disorder with mixed disturbance of emotions and conduct: Secondary | ICD-10-CM | POA: Diagnosis not present

## 2022-11-25 DIAGNOSIS — Z419 Encounter for procedure for purposes other than remedying health state, unspecified: Secondary | ICD-10-CM | POA: Diagnosis not present

## 2022-12-02 ENCOUNTER — Emergency Department (HOSPITAL_COMMUNITY)
Admission: EM | Admit: 2022-12-02 | Discharge: 2022-12-02 | Disposition: A | Discharge status: Home / Self Care | Payer: Medicaid Other | Attending: Emergency Medicine | Admitting: Emergency Medicine

## 2022-12-02 ENCOUNTER — Other Ambulatory Visit: Payer: Self-pay

## 2022-12-02 ENCOUNTER — Encounter (HOSPITAL_COMMUNITY): Payer: Self-pay | Admitting: Emergency Medicine

## 2022-12-02 ENCOUNTER — Emergency Department (HOSPITAL_COMMUNITY): Payer: Medicaid Other

## 2022-12-02 DIAGNOSIS — Z7982 Long term (current) use of aspirin: Secondary | ICD-10-CM | POA: Insufficient documentation

## 2022-12-02 DIAGNOSIS — R079 Chest pain, unspecified: Secondary | ICD-10-CM | POA: Insufficient documentation

## 2022-12-02 DIAGNOSIS — I499 Cardiac arrhythmia, unspecified: Secondary | ICD-10-CM | POA: Diagnosis not present

## 2022-12-02 DIAGNOSIS — F32A Depression, unspecified: Secondary | ICD-10-CM | POA: Diagnosis not present

## 2022-12-02 DIAGNOSIS — Z743 Need for continuous supervision: Secondary | ICD-10-CM | POA: Diagnosis not present

## 2022-12-02 DIAGNOSIS — R0789 Other chest pain: Secondary | ICD-10-CM | POA: Diagnosis not present

## 2022-12-02 LAB — CBC
HCT: 40.1 % (ref 39.0–52.0)
Hemoglobin: 13.2 g/dL (ref 13.0–17.0)
MCH: 30.6 pg (ref 26.0–34.0)
MCHC: 32.9 g/dL (ref 30.0–36.0)
MCV: 93 fL (ref 80.0–100.0)
Platelets: 291 10*3/uL (ref 150–400)
RBC: 4.31 MIL/uL (ref 4.22–5.81)
RDW: 14.1 % (ref 11.5–15.5)
WBC: 8.6 10*3/uL (ref 4.0–10.5)
nRBC: 0 % (ref 0.0–0.2)

## 2022-12-02 LAB — BASIC METABOLIC PANEL
Anion gap: 7 (ref 5–15)
BUN: 16 mg/dL (ref 6–20)
CO2: 25 mmol/L (ref 22–32)
Calcium: 9.1 mg/dL (ref 8.9–10.3)
Chloride: 105 mmol/L (ref 98–111)
Creatinine, Ser: 1.03 mg/dL (ref 0.61–1.24)
GFR, Estimated: >60 mL/min (ref >60)
Glucose, Bld: 95 mg/dL (ref 70–99)
Potassium: 4 mmol/L (ref 3.5–5.1)
Sodium: 137 mmol/L (ref 135–145)

## 2022-12-02 LAB — TROPONIN I (HIGH SENSITIVITY)
Troponin I (High Sensitivity): 4 ng/L (ref <18)
Troponin I (High Sensitivity): 4 ng/L (ref <18)

## 2022-12-02 MED ORDER — ASPIRIN 81 MG PO TBEC
162.0000 mg | DELAYED_RELEASE_TABLET | Freq: Every day | ORAL | Status: DC
  Administered 2022-12-02: 162 mg via ORAL
  Filled 2022-12-02: qty 2
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
## 2022-12-02 NOTE — ED Triage Notes (Signed)
Pt c/o of dull chest pain in the center of his chest radiating down his left arm. Pt took 81mg  of aspirin this morning.   Pt had one nitro with EMS. No relief in pain. Pt has 18G in LAC.

## 2022-12-02 NOTE — ED Provider Notes (Signed)
[facility] Provider Note   CSN: [id number] Arrival date & time: 12/02/22  1042     History  Chief Complaint  Patient presents with   Chest Pain   HPI Reginald Thompson is a 47 y.o. male with history of PTSD present for chest pain. Started 45 minutes ago. He was at home watching the news. He felt left-sided chest pain that felt like pressure and dull and radiated down to his fingertips on the left side. The pain has been constant since.  Denies shortness of breath and calf tenderness.  Also denies diaphoresis. Called EMS. Was given nitro in route and chest pain stayed the same per patient.  Smoked marijuana this morning.  Denies any other drug use.   Chest Pain      Home Medications Prior to Admission medications   Medication Sig Start Date End Date Taking? Authorizing Provider  aspirin EC 81 MG tablet Take 81 mg by mouth daily. Swallow whole.   Yes [provider]  HYDROcodone-acetaminophen (NORCO) 5-325 MG tablet 1-2 tabs po q6 hours prn pain Patient not taking: Reported on 12/02/2022 10/13/20   Leanora Cover, MD  ibuprofen (ADVIL) 600 MG tablet Take 1 tablet (600 mg total) by mouth every 6 (six) hours as needed. Patient not taking: Reported on 12/02/2022 09/25/20   Isla Pence, MD      Allergies    Penicillins    Review of Systems   Review of Systems  Cardiovascular:  Positive for chest pain.    Physical Exam Updated Vital Signs BP 113/69   Pulse (!) 47   Resp 12   Ht 6' 1"$  (1.854 m)   Wt 125.6 kg   SpO2 96%   BMI 36.55 kg/m  Physical Exam Vitals and nursing note reviewed.  HENT:     Head: Normocephalic and atraumatic.     Mouth/Throat:     Mouth: Mucous membranes are moist.  Eyes:     General:        Right eye: No discharge.        Left eye: No discharge.     Conjunctiva/sclera: Conjunctivae normal.  Cardiovascular:     Rate and Rhythm: Normal rate and regular rhythm.     Pulses: Normal pulses.      Heart sounds: Normal heart sounds.  Pulmonary:     Effort: Pulmonary effort is normal.     Breath sounds: Normal breath sounds.  Abdominal:     General: Abdomen is flat.     Palpations: Abdomen is soft.  Skin:    General: Skin is warm and dry.  Neurological:     General: No focal deficit present.  Psychiatric:        Mood and Affect: Mood normal.     ED Results / Procedures / Treatments   Labs (all labs ordered are listed, but only abnormal results are displayed) Labs Reviewed  BASIC METABOLIC PANEL  CBC  TROPONIN I (HIGH SENSITIVITY)  TROPONIN I (HIGH SENSITIVITY)    EKG EKG Interpretation  Date/Time:  Thursday December 02 2022 10:50:23 EST Ventricular Rate:  60 PR Interval:  204 QRS Duration: 84 QT Interval:  410 QTC Calculation: 410 R Axis:   60 Text Interpretation: Sinus rhythm Borderline prolonged PR interval no significant change since Nov 2023 Confirmed by Sherwood Gambler [phone]) on 12/02/2022 3:08:07 PM  Radiology DG Chest Port 1 View  Result Date: 12/02/2022 CLINICAL DATA:  Chest pain radiating into the left arm  EXAM: PORTABLE CHEST 1 VIEW COMPARISON:  Chest radiograph dated 09/15/2022 FINDINGS: Normal lung volumes. No focal consolidations. No pleural effusion or pneumothorax. The heart size and mediastinal contours are within normal limits. The visualized skeletal structures are unremarkable. IMPRESSION: No active disease. Electronically Signed   By: Darrin Nipper M.D.   On: 12/02/2022 11:29    Procedures Procedures    Medications Ordered in ED Medications  aspirin EC tablet 162 mg (162 mg Oral Given 12/02/22 1108)    ED Course/ Medical Decision Making/ A&P                             Medical Decision Making Amount and/or Complexity of Data Reviewed Labs: ordered. Radiology: ordered.  Risk OTC drugs.   Initial Impression and Ddx 47 year old male who is well-appearing and hemodynamically stable presenting for chest pain.  Exam unremarkable.  DDx  includes ACS, PE, pneumonia, CHF exacerbation, and aortic dissection. Patient PMH that increases complexity of ED encounter:  PTSD  Interpretation of Diagnostics I independent reviewed and interpreted the labs as followed: No acute derangement  - I independently visualized the following imaging with scope of interpretation limited to determining acute life threatening conditions related to emergency care: Chest x-ray which revealed no acute cardiopulmonary process  I personally reviewed and interpreted EKG which revealed sinus rhythm  Patient Reassessment and Ultimate Disposition/Management History of his chest pain was concerning for ACS.  Treated with aspirin.  Overall evaluation was unremarkable.  Rechecked patient and he stated that his chest pain had completely gone away.  Per chart review patient was evaluated for chest pain November of last year.  Was advised to follow-up with cardiology but states that he has had issues with his insurance.  States he now has Insurance underwriter and will surely follow-up with them.  Heart score was 4.  But given reassuring evaluation and asymptomatic after recheck, I feel it is appropriate for him to be discharged with close follow-up with cardiology.  Discussed return precautions.  Discharged stable vitals.  Patient management required discussion with the following services or consulting groups:  None  Complexity of Problems Addressed Acute complicated illness or Injury  Additional Data Reviewed and Analyzed Further history obtained from: Further history from spouse/family member, Past medical history and medications listed in the EMR, and Prior ED visit notes  Patient Encounter Risk Assessment Consideration of hospitalization         Final Clinical Impression(s) / ED Diagnoses Final diagnoses:  Chest pain, unspecified type    Rx / DC Orders ED Discharge Orders          Ordered    Ambulatory referral to Cardiology       Comments: If  you have not heard from the Cardiology office within the next 72 hours please call [phone].   12/02/22 1621              Harriet Pho, PA-C 12/02/22 1622    Sherwood Gambler, MD 12/03/22 [phone]

## 2022-12-02 NOTE — Discharge Instructions (Addendum)
Evaluation for your chest pain today was overall reassuring.  Recommend that you do follow-up with cardiology.  If you have new chest pain, shortness of breath, unexplained sweating or any other concerning symptom please return to the emergency department for further evaluation.

## 2022-12-07 ENCOUNTER — Ambulatory Visit: Payer: Medicaid Other | Attending: Internal Medicine | Admitting: Internal Medicine

## 2022-12-07 ENCOUNTER — Encounter: Payer: Self-pay | Admitting: Internal Medicine

## 2022-12-07 VITALS — BP 130/78 | HR 66 | Ht 73.0 in | Wt 282.0 lb

## 2022-12-07 DIAGNOSIS — R0683 Snoring: Secondary | ICD-10-CM | POA: Diagnosis not present

## 2022-12-07 DIAGNOSIS — M5412 Radiculopathy, cervical region: Secondary | ICD-10-CM

## 2022-12-07 DIAGNOSIS — R29818 Other symptoms and signs involving the nervous system: Secondary | ICD-10-CM | POA: Diagnosis not present

## 2022-12-07 DIAGNOSIS — R079 Chest pain, unspecified: Secondary | ICD-10-CM | POA: Diagnosis not present

## 2022-12-07 MED ORDER — METOPROLOL TARTRATE 100 MG PO TABS: 100.0000 mg | ORAL_TABLET | Freq: Once | ORAL | 0 refills | Status: DC
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
## 2022-12-07 NOTE — Patient Instructions (Addendum)
Medication Instructions:  Your physician has recommended you make the following change in your medication:  Stop aspirin Continue other medication the same  Labwork: none  Testing/Procedures: Your physician has requested that you have an echocardiogram. Echocardiography is a painless test that uses sound waves to create images of your heart. It provides your doctor with information about the size and shape of your heart and how well your heart's chambers and valves are working. This procedure takes approximately one hour. There are no restrictions for this procedure. Please do NOT wear cologne, perfume, aftershave, or lotions (deodorant is allowed). Please arrive 15 minutes prior to your appointment time. Coronary CTA-see instruction below  Follow-Up: Your physician recommends that you schedule a follow-up appointment in: 1 year. You will receive a reminder call in the mail in about 10 months reminding you to call and schedule your appointment. If you don't receive this call, please contact our office.  Any Other Special Instructions Will Be Listed Below (If Applicable). You have been referred to Pulmonology  If you need a refill on your cardiac medications before your next appointment, please call your pharmacy.    Your cardiac CT will be scheduled at one of the below locations:   [facility] [address] [place], [place] [address] [phone]  If scheduled at [facility], please arrive at the [facility] and Children's Entrance (Entrance C2) of [facility] 30 minutes prior to test start time. You can use the FREE valet parking offered at entrance C (encouraged to control the heart rate for the test)  Proceed to the [facility] Radiology Department (first floor) to check-in and test prep.  All radiology patients and guests should use entrance C2 at [facility], accessed from [facility], even though the hospital's physical address  listed is [address]    Please follow these instructions carefully (unless otherwise directed):  Hold all erectile dysfunction medications at least 3 days (72 hrs) prior to test. (Ie viagra, cialis, sildenafil, tadalafil, etc) We will administer nitroglycerin during this exam.   On the Night Before the Test: Be sure to Drink plenty of water. Do not consume any caffeinated/decaffeinated beverages or chocolate 12 hours prior to your test. Do not take any antihistamines 12 hours prior to your test. If the patient has contrast allergy: No contrast allegy  On the Day of the Test: Drink plenty of water until 1 hour prior to the test. Do not eat any food 1 hour prior to test. You may take your regular medications prior to the test.  Take metoprolol (Lopressor) 100 mg two hours prior to test.      After the Test: Drink plenty of water. After receiving IV contrast, you may experience a mild flushed feeling. This is normal. On occasion, you may experience a mild rash up to 24 hours after the test. This is not dangerous. If this occurs, you can take Benadryl 25 mg and increase your fluid intake. If you experience trouble breathing, this can be serious. If it is severe call 911 IMMEDIATELY. If it is mild, please call our office.  We will call to schedule your test 2-4 weeks out understanding that some insurance companies will need an authorization prior to the service being performed.   For non-scheduling related questions, please contact the cardiac imaging nurse navigator should you have any questions/concerns: Marchia Bond, Cardiac Imaging Nurse Navigator Gordy Clement, Cardiac Imaging Nurse Navigator [facility]  Direct Office Dial: [phone]   For scheduling needs, including cancellations and rescheduling, please call [place], [phone].

## 2022-12-08 ENCOUNTER — Other Ambulatory Visit: Payer: Self-pay | Admitting: Internal Medicine

## 2022-12-08 ENCOUNTER — Ambulatory Visit: Payer: Medicaid Other | Attending: Internal Medicine

## 2022-12-08 DIAGNOSIS — R079 Chest pain, unspecified: Secondary | ICD-10-CM

## 2022-12-08 LAB — ECHOCARDIOGRAM COMPLETE
AR max vel: 2.83 cm2
AV Peak grad: 9.2 mmHg
Ao pk vel: 1.52 m/s
Area-P 1/2: 2.71 cm2
Calc EF: 64.9 %
MV M vel: 4.13 m/s
MV Peak grad: 68.3 mmHg
S' Lateral: 3.2 cm
Single Plane A2C EF: 68 %
Single Plane A4C EF: 60.6 %

## 2022-12-09 ENCOUNTER — Ambulatory Visit (INDEPENDENT_AMBULATORY_CARE_PROVIDER_SITE_OTHER): Payer: Medicaid Other | Admitting: Internal Medicine

## 2022-12-09 ENCOUNTER — Encounter: Payer: Self-pay | Admitting: *Deleted

## 2022-12-09 ENCOUNTER — Encounter: Payer: Self-pay | Admitting: Internal Medicine

## 2022-12-09 VITALS — BP 129/81 | HR 67 | Ht 73.0 in | Wt 271.6 lb

## 2022-12-09 DIAGNOSIS — Z0001 Encounter for general adult medical examination with abnormal findings: Secondary | ICD-10-CM

## 2022-12-09 DIAGNOSIS — Z131 Encounter for screening for diabetes mellitus: Secondary | ICD-10-CM

## 2022-12-09 DIAGNOSIS — Z114 Encounter for screening for human immunodeficiency virus [HIV]: Secondary | ICD-10-CM

## 2022-12-09 DIAGNOSIS — Z1211 Encounter for screening for malignant neoplasm of colon: Secondary | ICD-10-CM | POA: Diagnosis not present

## 2022-12-09 DIAGNOSIS — G8929 Other chronic pain: Secondary | ICD-10-CM

## 2022-12-09 DIAGNOSIS — Z13220 Encounter for screening for lipoid disorders: Secondary | ICD-10-CM

## 2022-12-09 DIAGNOSIS — M542 Cervicalgia: Secondary | ICD-10-CM

## 2022-12-09 DIAGNOSIS — Z1321 Encounter for screening for nutritional disorder: Secondary | ICD-10-CM

## 2022-12-09 DIAGNOSIS — L989 Disorder of the skin and subcutaneous tissue, unspecified: Secondary | ICD-10-CM

## 2022-12-09 DIAGNOSIS — R079 Chest pain, unspecified: Secondary | ICD-10-CM

## 2022-12-09 DIAGNOSIS — R0683 Snoring: Secondary | ICD-10-CM

## 2022-12-09 DIAGNOSIS — Z2821 Immunization not carried out because of patient refusal: Secondary | ICD-10-CM | POA: Diagnosis not present

## 2022-12-09 DIAGNOSIS — F4310 Post-traumatic stress disorder, unspecified: Secondary | ICD-10-CM | POA: Diagnosis not present

## 2022-12-09 DIAGNOSIS — Z1159 Encounter for screening for other viral diseases: Secondary | ICD-10-CM | POA: Diagnosis not present

## 2022-12-09 DIAGNOSIS — Z1329 Encounter for screening for other suspected endocrine disorder: Secondary | ICD-10-CM

## 2022-12-09 DIAGNOSIS — M545 Low back pain, unspecified: Secondary | ICD-10-CM | POA: Diagnosis not present

## 2022-12-09 DIAGNOSIS — F1290 Cannabis use, unspecified, uncomplicated: Secondary | ICD-10-CM

## 2022-12-09 NOTE — Patient Instructions (Signed)
It was a pleasure to see you today.  Thank you for giving [place] the opportunity to be involved in your care.  Below is a brief recap of your visit and next steps.  We will plan to see you again in 3 months.  Summary You have established care today. We will check labs  I have placed referrals to physical and gastroenterology We will follow up in 3 months
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
## 2022-12-10 LAB — CBC WITH DIFFERENTIAL/PLATELET

## 2022-12-11 LAB — LIPID PANEL
Chol/HDL Ratio: 7.2 ratio — ABNORMAL HIGH (ref 0.0–5.0)
Cholesterol, Total: 280 mg/dL — ABNORMAL HIGH (ref 100–199)
HDL: 39 mg/dL — ABNORMAL LOW (ref >39)
LDL Chol Calc (NIH): 215 mg/dL — ABNORMAL HIGH (ref 0–99)
Triglycerides: 138 mg/dL (ref 0–149)
VLDL Cholesterol Cal: 26 mg/dL (ref 5–40)

## 2022-12-11 LAB — CBC WITH DIFFERENTIAL/PLATELET
Basophils Absolute: 0.1 10*3/uL (ref 0.0–0.2)
Basos: 1 %
EOS (ABSOLUTE): 0.1 10*3/uL (ref 0.0–0.4)
Eos: 2 %
Hematocrit: 42.3 % (ref 37.5–51.0)
Hemoglobin: 14.1 g/dL (ref 13.0–17.7)
Immature Grans (Abs): 0 10*3/uL (ref 0.0–0.1)
Immature Granulocytes: 0 %
Lymphocytes Absolute: 2.6 10*3/uL (ref 0.7–3.1)
Lymphs: 31 %
MCH: 30.5 pg (ref 26.6–33.0)
MCHC: 33.3 g/dL (ref 31.5–35.7)
MCV: 92 fL (ref 79–97)
Monocytes Absolute: 0.6 10*3/uL (ref 0.1–0.9)
Monocytes: 7 %
Neutrophils Absolute: 5 10*3/uL (ref 1.4–7.0)
Neutrophils: 59 %
Platelets: 335 10*3/uL (ref 150–450)
RBC: 4.62 x10E6/uL (ref 4.14–5.80)
RDW: 13.7 % (ref 11.6–15.4)
WBC: 8.3 10*3/uL (ref 3.4–10.8)

## 2022-12-11 LAB — CMP14+EGFR
ALT: 41 IU/L (ref 0–44)
AST: 22 IU/L (ref 0–40)
Albumin/Globulin Ratio: 1.7 (ref 1.2–2.2)
Albumin: 4.6 g/dL (ref 4.1–5.1)
Alkaline Phosphatase: 96 IU/L (ref 44–121)
BUN/Creatinine Ratio: 11 (ref 9–20)
BUN: 13 mg/dL (ref 6–24)
Bilirubin Total: 0.4 mg/dL (ref 0.0–1.2)
CO2: 20 mmol/L (ref 20–29)
Calcium: 9.8 mg/dL (ref 8.7–10.2)
Chloride: 103 mmol/L (ref 96–106)
Creatinine, Ser: 1.14 mg/dL (ref 0.76–1.27)
Globulin, Total: 2.7 g/dL (ref 1.5–4.5)
Glucose: 94 mg/dL (ref 70–99)
Potassium: 4.5 mmol/L (ref 3.5–5.2)
Sodium: 140 mmol/L (ref 134–144)
Total Protein: 7.3 g/dL (ref 6.0–8.5)
eGFR: 79 mL/min/{1.73_m2} (ref >59)

## 2022-12-11 LAB — HEMOGLOBIN A1C
Est. average glucose Bld gHb Est-mCnc: 126 mg/dL
Hgb A1c MFr Bld: 6 % — ABNORMAL HIGH (ref 4.8–5.6)

## 2022-12-11 LAB — TSH+FREE T4
Free T4: 1.2 ng/dL (ref 0.82–1.77)
TSH: 1.62 u[IU]/mL (ref 0.450–4.500)

## 2022-12-11 LAB — VITAMIN D 25 HYDROXY (VIT D DEFICIENCY, FRACTURES): Vit D, 25-Hydroxy: 23.4 ng/mL — ABNORMAL LOW (ref 30.0–100.0)

## 2022-12-11 LAB — B12 AND FOLATE PANEL
Folate: 18.2 ng/mL (ref >3.0)
Vitamin B-12: 592 pg/mL (ref 232–1245)

## 2022-12-11 LAB — HIV ANTIBODY (ROUTINE TESTING W REFLEX): HIV Screen 4th Generation wRfx: NONREACTIVE

## 2022-12-11 LAB — HCV INTERPRETATION

## 2022-12-11 LAB — HCV AB W REFLEX TO QUANT PCR: HCV Ab: NONREACTIVE

## 2022-12-14 ENCOUNTER — Telehealth (HOSPITAL_COMMUNITY): Payer: Self-pay | Admitting: *Deleted

## 2022-12-14 NOTE — Telephone Encounter (Signed)
Reaching out to patient to offer assistance regarding upcoming cardiac imaging study; pt verbalizes understanding of appt date/time, parking situation and where to check in, pre-test NPO status, and verified current allergies; name and call back number provided for further questions should they arise  Reginald Clement RN Navigator Cardiac [facility] [phone] office [phone] cell  Patient to arrive at 11:30am.

## 2022-12-15 ENCOUNTER — Encounter: Payer: Self-pay | Admitting: Internal Medicine

## 2022-12-15 ENCOUNTER — Ambulatory Visit (HOSPITAL_COMMUNITY): Admission: RE | Admit: 2022-12-15 | Payer: Medicaid Other | Source: Ambulatory Visit

## 2022-12-15 DIAGNOSIS — G8929 Other chronic pain: Secondary | ICD-10-CM | POA: Insufficient documentation

## 2022-12-15 DIAGNOSIS — F1290 Cannabis use, unspecified, uncomplicated: Secondary | ICD-10-CM | POA: Insufficient documentation

## 2022-12-15 DIAGNOSIS — Z0001 Encounter for general adult medical examination with abnormal findings: Secondary | ICD-10-CM | POA: Insufficient documentation

## 2022-12-15 DIAGNOSIS — L989 Disorder of the skin and subcutaneous tissue, unspecified: Secondary | ICD-10-CM | POA: Insufficient documentation

## 2022-12-15 DIAGNOSIS — F4310 Post-traumatic stress disorder, unspecified: Secondary | ICD-10-CM | POA: Insufficient documentation

## 2022-12-15 NOTE — Assessment & Plan Note (Signed)
Previously prescribed citalopram as well as trazodone but is not on any medications currently.  He is not interested in restarting medication or a referral to psychiatry/counseling.

## 2022-12-15 NOTE — Assessment & Plan Note (Signed)
Recent ED presentation for chest discomfort.  Cardiac workup unremarkable at that time.  He was referred to cardiology to establish care and was seen by Dr. Dellia Cloud on 2/13. TTE normal. CT coronary calcium score pending.  He has not had any recurrence of chest pain recently.

## 2022-12-15 NOTE — Assessment & Plan Note (Signed)
STOP-BANG > 3.  He has recently been referred to sleep medicine for OSA evaluation and has an appointment scheduled for 4/17.

## 2022-12-15 NOTE — Assessment & Plan Note (Signed)
Presenting today to establish care.  Previous records have been reviewed. -Baseline labs ordered today, including one-time HIV/HCV screenings -Influenza vaccine has been declined -GI referral placed for screening colonoscopy -We will tentatively plan for follow-up in 3 months

## 2022-12-15 NOTE — Assessment & Plan Note (Signed)
Endorses a history of chronic lumbar back pain for which she is currently on disability.  He takes Tylenol/NSAIDs as needed for pain relief.  Not followed by pain management currently.  He is interested in additional treatment options today.  He has never attempted physical therapy previously. -Physical therapy referral placed today.  He is in agreement with this plan

## 2022-12-15 NOTE — Assessment & Plan Note (Signed)
Dermatology referral placed today

## 2022-12-15 NOTE — Assessment & Plan Note (Signed)
Uses marijuana daily for pain control

## 2022-12-21 ENCOUNTER — Telehealth (HOSPITAL_COMMUNITY): Payer: Self-pay | Admitting: Emergency Medicine

## 2022-12-21 NOTE — Telephone Encounter (Signed)
Reaching out to patient to offer assistance regarding upcoming cardiac imaging study; pt verbalizes understanding of appt date/time, parking situation and where to check in, pre-test NPO status and medications ordered, and verified current allergies; name and call back number provided for further questions should they arise Marchia Bond RN Navigator Cardiac Imaging [facility] [phone] office [phone] cell  Arrival [address] entrance No meds Aware contrast / nitro

## 2022-12-22 ENCOUNTER — Ambulatory Visit (HOSPITAL_COMMUNITY)
Admission: RE | Admit: 2022-12-22 | Discharge: 2022-12-22 | Disposition: A | Discharge status: Home / Self Care | Payer: Medicaid Other | Source: Ambulatory Visit | Attending: Internal Medicine | Admitting: Internal Medicine

## 2022-12-22 DIAGNOSIS — R079 Chest pain, unspecified: Secondary | ICD-10-CM | POA: Insufficient documentation

## 2022-12-22 DIAGNOSIS — Z7689 Persons encountering health services in other specified circumstances: Secondary | ICD-10-CM | POA: Diagnosis not present

## 2022-12-22 MED ORDER — IOHEXOL 350 MG/ML SOLN
95.0000 mL | Freq: Once | INTRAVENOUS | Status: AC | PRN
  Administered 2022-12-22: 95 mL via INTRAVENOUS

## 2022-12-22 MED ORDER — NITROGLYCERIN 0.4 MG SL SUBL
SUBLINGUAL_TABLET | SUBLINGUAL | Status: AC
  Administered 2022-12-22: 0.8 mg via SUBLINGUAL
  Filled 2022-12-22: qty 2

## 2022-12-22 MED ORDER — NITROGLYCERIN 0.4 MG SL SUBL: 0.8000 mg | SUBLINGUAL_TABLET | Freq: Once | SUBLINGUAL | Status: AC

## 2022-12-23 ENCOUNTER — Telehealth: Payer: Self-pay | Admitting: *Deleted

## 2022-12-23 NOTE — Telephone Encounter (Signed)
Per Dr. Dellia Cloud, patient does not have to return back to cardiology clinic as CTA cardiac is normal. Schedule PRN follow up.  Patient informed and verbalized understanding of plan.

## 2022-12-24 DIAGNOSIS — Z419 Encounter for procedure for purposes other than remedying health state, unspecified: Secondary | ICD-10-CM | POA: Diagnosis not present
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
## 2022-12-31 ENCOUNTER — Ambulatory Visit (HOSPITAL_COMMUNITY): Payer: Medicaid Other | Attending: Internal Medicine

## 2022-12-31 ENCOUNTER — Other Ambulatory Visit: Payer: Self-pay

## 2022-12-31 DIAGNOSIS — M542 Cervicalgia: Secondary | ICD-10-CM | POA: Insufficient documentation

## 2022-12-31 DIAGNOSIS — G8929 Other chronic pain: Secondary | ICD-10-CM | POA: Insufficient documentation

## 2022-12-31 DIAGNOSIS — M545 Low back pain, unspecified: Secondary | ICD-10-CM | POA: Diagnosis not present

## 2023-01-03 ENCOUNTER — Ambulatory Visit (HOSPITAL_COMMUNITY): Payer: Medicaid Other | Admitting: Physical Therapy

## 2023-01-03 ENCOUNTER — Encounter (HOSPITAL_COMMUNITY): Payer: Self-pay | Admitting: Physical Therapy

## 2023-01-03 DIAGNOSIS — M542 Cervicalgia: Secondary | ICD-10-CM | POA: Diagnosis not present

## 2023-01-03 DIAGNOSIS — M545 Low back pain, unspecified: Secondary | ICD-10-CM

## 2023-01-03 DIAGNOSIS — G8929 Other chronic pain: Secondary | ICD-10-CM | POA: Diagnosis not present
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
## 2023-01-06 ENCOUNTER — Ambulatory Visit (HOSPITAL_COMMUNITY): Payer: Medicaid Other | Admitting: Physical Therapy

## 2023-01-06 DIAGNOSIS — M545 Low back pain, unspecified: Secondary | ICD-10-CM | POA: Diagnosis not present

## 2023-01-06 DIAGNOSIS — M542 Cervicalgia: Secondary | ICD-10-CM

## 2023-01-06 DIAGNOSIS — G8929 Other chronic pain: Secondary | ICD-10-CM | POA: Diagnosis not present
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
## 2023-01-10 DIAGNOSIS — Z7689 Persons encountering health services in other specified circumstances: Secondary | ICD-10-CM | POA: Diagnosis not present

## 2023-01-12 ENCOUNTER — Encounter (HOSPITAL_COMMUNITY): Payer: Medicaid Other | Admitting: Physical Therapy

## 2023-01-14 ENCOUNTER — Ambulatory Visit (HOSPITAL_COMMUNITY): Payer: Medicaid Other | Admitting: Physical Therapy

## 2023-01-14 DIAGNOSIS — M545 Low back pain, unspecified: Secondary | ICD-10-CM

## 2023-01-14 DIAGNOSIS — M542 Cervicalgia: Secondary | ICD-10-CM | POA: Diagnosis not present

## 2023-01-14 DIAGNOSIS — G8929 Other chronic pain: Secondary | ICD-10-CM | POA: Diagnosis not present

## 2023-01-14 DIAGNOSIS — Z7689 Persons encountering health services in other specified circumstances: Secondary | ICD-10-CM | POA: Diagnosis not present
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
## 2023-01-19 ENCOUNTER — Encounter (HOSPITAL_COMMUNITY): Payer: Self-pay | Admitting: Physical Therapy

## 2023-01-19 ENCOUNTER — Encounter (HOSPITAL_COMMUNITY): Payer: Medicaid Other | Admitting: Physical Therapy

## 2023-01-19 NOTE — Therapy (Unsigned)
[place] [facility] [place], [place], [address] Phone: [phone]   Fax:  [phone]  Patient Details  Name: Reginald Thompson MRN: [id number] Date of Birth: 1975/12/03 Referring Provider:  No ref. provider found  Encounter Date: 01/19/2023 PHYSICAL THERAPY DISCHARGE SUMMARY  Visits from Start of Care: 4  Current functional level related to goals / functional outcomes: Unknown as pt did not return   Remaining deficits: Unknown as pt did not return    Education / Equipment: HEP   Patient agrees to discharge. Patient goals were not met. Patient is being discharged due to  self discharge .   [name], PT CLT [phone]  01/19/2023, 8:12 AM  [facility] at [place], [place], [address] Phone: [phone]   Fax:  [phone]

## 2023-01-21 ENCOUNTER — Encounter (HOSPITAL_COMMUNITY): Payer: Medicaid Other

## 2023-01-24 ENCOUNTER — Encounter (HOSPITAL_COMMUNITY): Payer: Medicaid Other

## 2023-01-24 DIAGNOSIS — Z419 Encounter for procedure for purposes other than remedying health state, unspecified: Secondary | ICD-10-CM | POA: Diagnosis not present

## 2023-01-26 ENCOUNTER — Ambulatory Visit (INDEPENDENT_AMBULATORY_CARE_PROVIDER_SITE_OTHER): Payer: Medicaid Other | Admitting: Pulmonary Disease

## 2023-01-26 ENCOUNTER — Encounter (HOSPITAL_COMMUNITY): Payer: Medicaid Other

## 2023-01-26 ENCOUNTER — Encounter: Payer: Self-pay | Admitting: Pulmonary Disease

## 2023-01-26 VITALS — BP 144/92 | HR 62 | Ht 72.0 in | Wt 283.8 lb

## 2023-01-26 DIAGNOSIS — R0683 Snoring: Secondary | ICD-10-CM

## 2023-01-26 DIAGNOSIS — I10 Essential (primary) hypertension: Secondary | ICD-10-CM | POA: Diagnosis not present

## 2023-01-26 DIAGNOSIS — G4733 Obstructive sleep apnea (adult) (pediatric): Secondary | ICD-10-CM

## 2023-01-26 DIAGNOSIS — Z7689 Persons encountering health services in other specified circumstances: Secondary | ICD-10-CM | POA: Diagnosis not present
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
## 2023-01-26 NOTE — Patient Instructions (Signed)
X split night study 

## 2023-02-09 ENCOUNTER — Institutional Professional Consult (permissible substitution): Payer: Medicaid Other | Admitting: Pulmonary Disease

## 2023-02-23 DIAGNOSIS — Z419 Encounter for procedure for purposes other than remedying health state, unspecified: Secondary | ICD-10-CM | POA: Diagnosis not present

## 2023-03-17 ENCOUNTER — Ambulatory Visit (INDEPENDENT_AMBULATORY_CARE_PROVIDER_SITE_OTHER): Payer: Medicaid Other | Admitting: Internal Medicine

## 2023-03-17 ENCOUNTER — Encounter: Payer: Self-pay | Admitting: Internal Medicine

## 2023-03-17 VITALS — BP 117/69 | HR 60 | Ht 72.0 in | Wt 279.6 lb

## 2023-03-17 DIAGNOSIS — G8929 Other chronic pain: Secondary | ICD-10-CM | POA: Diagnosis not present

## 2023-03-17 DIAGNOSIS — R7303 Prediabetes: Secondary | ICD-10-CM

## 2023-03-17 DIAGNOSIS — E559 Vitamin D deficiency, unspecified: Secondary | ICD-10-CM | POA: Diagnosis not present

## 2023-03-17 DIAGNOSIS — E782 Mixed hyperlipidemia: Secondary | ICD-10-CM

## 2023-03-17 DIAGNOSIS — M545 Low back pain, unspecified: Secondary | ICD-10-CM

## 2023-03-17 DIAGNOSIS — Z7689 Persons encountering health services in other specified circumstances: Secondary | ICD-10-CM | POA: Diagnosis not present

## 2023-03-17 MED ORDER — ATORVASTATIN CALCIUM 40 MG PO TABS: 40.0000 mg | ORAL_TABLET | Freq: Every day | ORAL | 3 refills | Status: AC

## 2023-03-17 NOTE — Patient Instructions (Signed)
It was a pleasure to see you today.  Thank you for giving [place] the opportunity to be involved in your care.  Below is a brief recap of your visit and next steps.  We will plan to see you again in 6 months.  Summary Start atorvastatin 40 mg daily for high cholesterol Check genetic markers for familial hypercholesterolemia Neurosurgery referral and xray ordered for back pain Follow up in 6 months

## 2023-03-17 NOTE — Progress Notes (Signed)
Established Patient Office Visit  Subjective   Patient ID: FAMOUS SPELLER, male    DOB: 08-23-1976  Age: 47 y.o. MRN: [id number]  Chief Complaint  Patient presents with   Follow-up    Still having back pain. Therapy didn't seem to help. Pain moved to lower part of back   Reginald Thompson returns to care today for follow-up.  He was last evaluated by me on 2/15 as a new patient presenting to establish care.  He was referred to dermatology at that time for evaluation of a lesion on his right arm.  Physical therapy referral placed as he endorsed chronic lumbar back pain.  He was additionally referred to gastroenterology for screening colonoscopy.  In the interim he has been evaluated by pulmonology for OSA.  There have otherwise been no acute interval events.  Reginald Thompson reports today that his back pain has worsened.  He attempted physical therapy, but states that this only made his back pain worse.  He endorses intermittent weakness in his lower extremities.  He has no additional concerns to discuss today.  Past Medical History:  Diagnosis Date   PTSD (post-traumatic stress disorder)    Past Surgical History:  Procedure Laterality Date   CLOSED REDUCTION METACARPAL WITH PERCUTANEOUS PINNING Right 10/13/2020   Procedure: CLOSED REDUCTION METACARPAL WITH PERCUTANEOUS PINNING;  Surgeon: Betha Loa, MD;  [facility];  Service: Orthopedics;  Laterality: Right;   NO PAST SURGERIES     Social History   Tobacco Use   Smoking status: Former    Types: Cigarettes   Smokeless tobacco: Never  Vaping Use   Vaping Use: Never used  Substance Use Topics   Alcohol use: No   Drug use: Yes    Types: Marijuana    Comment: last night 10/12/2020 smoked    Family History  Problem Relation Age of Onset   Hypertension Mother    Diabetes Other    Cancer Other    Allergies  Allergen Reactions   Penicillins Anaphylaxis and Rash    Has patient had a PCN reaction causing  immediate rash, facial/tongue/throat swelling, SOB or lightheadedness with hypotension: Yes Has patient had a PCN reaction causing severe rash involving mucus membranes or skin necrosis: Yes Has patient had a PCN reaction that required hospitalization Yes Has patient had a PCN reaction occurring within the last 10 years: No If all of the above answers are "NO", then may proceed with Cephalosporin use.    Review of Systems  Musculoskeletal:  Positive for back pain (Lumbar back pain).  All other systems reviewed and are negative.    Objective:     BP 117/69   Pulse 60   Ht 6' (1.829 m)   Wt 279 lb 9.6 oz (126.8 kg)   SpO2 94%   BMI 37.92 kg/m  BP Readings from Last 3 Encounters:  03/17/23 117/69  01/26/23 (!) 144/92  12/22/22 (!) 123/57   Physical Exam Vitals reviewed.  Constitutional:      General: He is not in acute distress.    Appearance: Normal appearance. He is obese. He is not ill-appearing.  HENT:     Head: Normocephalic and atraumatic.     Right Ear: External ear normal.     Left Ear: External ear normal.     Nose: Nose normal. No congestion or rhinorrhea.     Mouth/Throat:     Mouth: Mucous membranes are moist.     Pharynx: Oropharynx is clear.  Eyes:  General: No scleral icterus.    Extraocular Movements: Extraocular movements intact.     Conjunctiva/sclera: Conjunctivae normal.     Pupils: Pupils are equal, round, and reactive to light.  Cardiovascular:     Rate and Rhythm: Normal rate and regular rhythm.     Pulses: Normal pulses.     Heart sounds: Normal heart sounds. No murmur heard. Pulmonary:     Effort: Pulmonary effort is normal.     Breath sounds: Normal breath sounds. No wheezing, rhonchi or rales.  Abdominal:     General: Abdomen is flat. Bowel sounds are normal. There is no distension.     Palpations: Abdomen is soft.     Tenderness: There is no abdominal tenderness.  Musculoskeletal:        General: No swelling or deformity. Normal  range of motion.     Cervical back: Normal range of motion.  Skin:    General: Skin is warm and dry.     Capillary Refill: Capillary refill takes less than 2 seconds.  Neurological:     General: No focal deficit present.     Mental Status: He is alert and oriented to person, place, and time.     Motor: No weakness.     Gait: Gait abnormal (Ambulates with walking stick).  Psychiatric:        Mood and Affect: Mood normal.        Behavior: Behavior normal.        Thought Content: Thought content normal.   Last CBC Lab Results  Component Value Date   WBC 8.3 12/09/2022   HGB 14.1 12/09/2022   HCT 42.3 12/09/2022   MCV 92 12/09/2022   MCH 30.5 12/09/2022   RDW 13.7 12/09/2022   PLT 335 12/09/2022   Last metabolic panel Lab Results  Component Value Date   GLUCOSE 94 12/09/2022   NA 140 12/09/2022   K 4.5 12/09/2022   CL 103 12/09/2022   CO2 20 12/09/2022   BUN 13 12/09/2022   CREATININE 1.14 12/09/2022   EGFR 79 12/09/2022   CALCIUM 9.8 12/09/2022   PROT 7.3 12/09/2022   ALBUMIN 4.6 12/09/2022   LABGLOB 2.7 12/09/2022   AGRATIO 1.7 12/09/2022   BILITOT 0.4 12/09/2022   ALKPHOS 96 12/09/2022   AST 22 12/09/2022   ALT 41 12/09/2022   ANIONGAP 7 12/02/2022   Last lipids Lab Results  Component Value Date   CHOL 280 (H) 12/09/2022   HDL 39 (L) 12/09/2022   LDLCALC 215 (H) 12/09/2022   TRIG 138 12/09/2022   CHOLHDL 7.2 (H) 12/09/2022   Last hemoglobin A1c Lab Results  Component Value Date   HGBA1C 6.0 (H) 12/09/2022   Last thyroid functions Lab Results  Component Value Date   TSH 1.620 12/09/2022   Last vitamin D Lab Results  Component Value Date   VD25OH 23.4 (L) 12/09/2022   Last vitamin B12 and Folate Lab Results  Component Value Date   VITAMINB12 592 12/09/2022   FOLATE 18.2 12/09/2022   The 10-year ASCVD risk score (Arnett DK, et al., 2019) is: 12.4%    Assessment & Plan:   Problem List Items Addressed This Visit       Chronic lumbar pain  - Primary    Persistent pain, worse with physical therapy.  He endorses intermittent lower extremity weakness.  Ambulates with a walking stick due to impaired gait and back pain.  No prior imaging available. -X-rays of the lumbar spine ordered today -Neurosurgery referral  placed for further evaluation and management       Prediabetes    A1c 6.0 on labs from February. -Last on modifications aimed at improving his blood sugar were reviewed today.      Vitamin D insufficiency    Noted on recent labs.  Daily vitamin D supplementation recommended.      Hyperlipidemia    Lipid panel updated in February.  Total cholesterol 280 and LDL 215.  He is unaware of any family history of familial hypercholesterolemia.  No family history of early MI/CVA, however he reports that his father had 8 CVAs before age 66.  Of note, CT coronary calcium recently completed with score 0. -Start atorvastatin 40 mg daily -Check labs to screen for familial hypercholesterolemia      Return in about 6 months (around 09/17/2023).   Billie Lade, MD

## 2023-03-18 ENCOUNTER — Telehealth: Payer: Self-pay | Admitting: *Deleted

## 2023-03-18 NOTE — Telephone Encounter (Signed)
  Procedure: Colonoscopy  Estimated body mass index is 37.92 kg/m as calculated from the following:   Height as of 03/17/23: 6' (1.829 m).   Weight as of 03/17/23: 279 lb 9.6 oz (126.8 kg).  Have you had a colonoscopy before?  no  Do you have family history of colon cancer?  yes  Do you have a family history of polyps? yes  Previous colonoscopy with polyps removed? no  Do you have a history colorectal cancer?   no  Are you diabetic?  yes  Do you have a prosthetic or mechanical heart valve? no  Do you have a pacemaker/defibrillator?   no  Have you had endocarditis/atrial fibrillation?  no  Do you use supplemental oxygen/CPAP?  no  Have you had joint replacement within the last 12 months?  no  Do you tend to be constipated or have to use laxatives?  no   Do you have history of alcohol use? If yes, how much and how often.  no  Do you have history or are you using drugs? If yes, what do are you  using?  no  Have you ever had a stroke/heart attack?    Have you ever had a heart or other vascular stent placed,?  Do you take weight loss medication? no  Do you take any blood-thinning medications such as: (Plavix, aspirin, Coumadin, Aggrenox, Brilinta, Xarelto, Eliquis, Pradaxa, Savaysa or Effient)? no  If yes we need the name, milligram, dosage and who is prescribing doctor:               Current Outpatient Medications  Medication Sig Dispense Refill   atorvastatin (LIPITOR) 40 MG tablet Take 1 tablet (40 mg total) by mouth daily. 90 tablet 3   No current facility-administered medications for this visit.    Allergies  Allergen Reactions   Penicillins Anaphylaxis and Rash    Has patient had a PCN reaction causing immediate rash, facial/tongue/throat swelling, SOB or lightheadedness with hypotension: Yes Has patient had a PCN reaction causing severe rash involving mucus membranes or skin necrosis: Yes Has patient had a PCN reaction that required hospitalization Yes Has  patient had a PCN reaction occurring within the last 10 years: No If all of the above answers are "NO", then may proceed with Cephalosporin use.

## 2023-03-23 DIAGNOSIS — E559 Vitamin D deficiency, unspecified: Secondary | ICD-10-CM | POA: Insufficient documentation

## 2023-03-23 DIAGNOSIS — E785 Hyperlipidemia, unspecified: Secondary | ICD-10-CM | POA: Insufficient documentation

## 2023-03-23 DIAGNOSIS — R7303 Prediabetes: Secondary | ICD-10-CM | POA: Insufficient documentation

## 2023-03-23 NOTE — Assessment & Plan Note (Signed)
Persistent pain, worse with physical therapy.  He endorses intermittent lower extremity weakness.  Ambulates with a walking stick due to impaired gait and back pain.  No prior imaging available. -X-rays of the lumbar spine ordered today -Neurosurgery referral placed for further evaluation and management

## 2023-03-23 NOTE — Assessment & Plan Note (Signed)
Lipid panel updated in February.  Total cholesterol 280 and LDL 215.  He is unaware of any family history of familial hypercholesterolemia.  No family history of early MI/CVA, however he reports that his father had 8 CVAs before age 49.  Of note, CT coronary calcium recently completed with score 0. -Start atorvastatin 40 mg daily -Check labs to screen for familial hypercholesterolemia

## 2023-03-23 NOTE — Assessment & Plan Note (Signed)
A1c 6.0 on labs from February. -Last on modifications aimed at improving his blood sugar were reviewed today.

## 2023-03-23 NOTE — Assessment & Plan Note (Signed)
Noted on recent labs.  Daily vitamin D supplementation recommended. 

## 2023-03-26 DIAGNOSIS — Z419 Encounter for procedure for purposes other than remedying health state, unspecified: Secondary | ICD-10-CM | POA: Diagnosis not present

## 2023-03-30 LAB — FAMILIAL HYPERCHOLESTEROLEMIA

## 2023-03-31 ENCOUNTER — Telehealth: Payer: Self-pay | Admitting: Internal Medicine

## 2023-03-31 NOTE — Telephone Encounter (Signed)
Spoke with patient.

## 2023-03-31 NOTE — Telephone Encounter (Signed)
Pt called for lab results. Stated he has missed call.

## 2023-04-05 ENCOUNTER — Encounter: Payer: Self-pay | Admitting: *Deleted

## 2023-04-05 ENCOUNTER — Other Ambulatory Visit: Payer: Self-pay | Admitting: *Deleted

## 2023-04-05 MED ORDER — PEG 3350-KCL-NA BICARB-NACL 420 G PO SOLR: 4000.0000 mL | Freq: Once | ORAL | 0 refills | Status: AC

## 2023-04-05 NOTE — Telephone Encounter (Signed)
OK to schedule. ASA 2.  ?

## 2023-04-05 NOTE — Telephone Encounter (Signed)
Referral completed

## 2023-04-05 NOTE — Telephone Encounter (Signed)
Pt has been scheduled for 04/26/23 with Dr.Carver. Instructions mailed and prep sent to the pharmacy.

## 2023-04-06 DIAGNOSIS — M5416 Radiculopathy, lumbar region: Secondary | ICD-10-CM | POA: Diagnosis not present

## 2023-04-06 DIAGNOSIS — Z7689 Persons encountering health services in other specified circumstances: Secondary | ICD-10-CM | POA: Diagnosis not present

## 2023-04-06 DIAGNOSIS — M47816 Spondylosis without myelopathy or radiculopathy, lumbar region: Secondary | ICD-10-CM | POA: Diagnosis not present

## 2023-04-06 DIAGNOSIS — Z6837 Body mass index (BMI) 37.0-37.9, adult: Secondary | ICD-10-CM | POA: Diagnosis not present

## 2023-04-18 ENCOUNTER — Telehealth: Payer: Self-pay | Admitting: *Deleted

## 2023-04-18 ENCOUNTER — Other Ambulatory Visit (HOSPITAL_COMMUNITY): Payer: Self-pay | Admitting: Neurosurgery

## 2023-04-18 DIAGNOSIS — M47816 Spondylosis without myelopathy or radiculopathy, lumbar region: Secondary | ICD-10-CM

## 2023-04-18 NOTE — Telephone Encounter (Signed)
Spoke with pt. Aware Dr. Marletta Lor not Dorthula Perfect 7/2 for procedure and will need to reschedule. Moved to 7/16. Aware will send new instructions. Endo aware of change.

## 2023-04-25 DIAGNOSIS — Z419 Encounter for procedure for purposes other than remedying health state, unspecified: Secondary | ICD-10-CM | POA: Diagnosis not present

## 2023-05-02 ENCOUNTER — Ambulatory Visit: Payer: Medicaid Other | Attending: Pulmonary Disease | Admitting: Pulmonary Disease

## 2023-05-02 DIAGNOSIS — G4761 Periodic limb movement disorder: Secondary | ICD-10-CM | POA: Diagnosis not present

## 2023-05-02 DIAGNOSIS — R0683 Snoring: Secondary | ICD-10-CM | POA: Diagnosis not present

## 2023-05-02 DIAGNOSIS — Z7689 Persons encountering health services in other specified circumstances: Secondary | ICD-10-CM | POA: Diagnosis not present

## 2023-05-03 ENCOUNTER — Telehealth: Payer: Self-pay | Admitting: Pulmonary Disease

## 2023-05-03 DIAGNOSIS — R0683 Snoring: Secondary | ICD-10-CM | POA: Diagnosis not present

## 2023-05-03 DIAGNOSIS — G4761 Periodic limb movement disorder: Secondary | ICD-10-CM

## 2023-05-03 DIAGNOSIS — R258 Other abnormal involuntary movements: Secondary | ICD-10-CM

## 2023-05-03 NOTE — Procedures (Signed)
Patient Name: Reginald Thompson, Reginald Thompson Date: 05/02/2023 Gender: Male D.O.B: Jun 27, 1976 Age (years): 1 Referring Provider: Cyril Mourning MD, ABSM Height (inches): 72 Interpreting Physician: Cyril Mourning MD, ABSM Weight (lbs): 279 RPSGT: Alfonso Ellis BMI: 38 MRN: [id number] Neck Size: 17.00 <br> <br> CLINICAL INFORMATION Sleep Study Type: NPSG    Indication for sleep study: snoring, non refreshing sleep, fatigue  Epworth Sleepiness Score: 12    SLEEP STUDY TECHNIQUE As per the AASM Manual for the Scoring of Sleep and Associated Events v2.3 (April 2016) with a hypopnea requiring 4% desaturations.  The channels recorded and monitored were frontal, central and occipital EEG, electrooculogram (EOG), submentalis EMG (chin), nasal and oral airflow, thoracic and abdominal wall motion, anterior tibialis EMG, snore microphone, electrocardiogram, and pulse oximetry.  MEDICATIONS Medications self-administered by patient taken the night of the study : N/A  SLEEP ARCHITECTURE The study was initiated at 9:37:49 PM and ended at 4:27:10 AM.  Sleep onset time was 4.7 minutes and the sleep efficiency was 76.1%. The total sleep time was 311.5 minutes.  Stage REM latency was 388.0 minutes.  The patient spent 6.42% of the night in stage N1 sleep, 52.49% in stage N2 sleep, 35.96% in stage N3 and 5.1% in REM.  Alpha intrusion was absent.  Supine sleep was 14.29%.  RESPIRATORY PARAMETERS The overall apnea/hypopnea index (AHI) was 2.3 per hour. There were 1 total apneas, including 1 obstructive, 0 central and 0 mixed apneas. There were 11 hypopneas and 3 RERAs.  The AHI during Stage REM sleep was 7.5 per hour.  AHI while supine was 10.8 per hour.  The mean oxygen saturation was 93.92%. The minimum SpO2 during sleep was 90.00%.  loud snoring was noted during this study.  CARDIAC DATA The 2 lead EKG demonstrated sinus rhythm. The mean heart rate was 45.92 beats per minute. Other EKG findings  include: None.   LEG MOVEMENT DATA The total PLMS were 308 with a resulting PLMS index of 59.33. Associated arousal with leg movement index was 11.7 .  IMPRESSIONS - No significant obstructive sleep apnea occurred during this study (AHI = 2.3/h). - The patient had minimal or no oxygen desaturation during the study (Min O2 = 90.00%) - The patient snored with loud snoring volume. - No cardiac abnormalities were noted during this study. - Severe periodic limb movements of sleep occurred during the study. Associated arousals were significant.   DIAGNOSIS - Periodic Limb Movement During Sleep (G47.61)   RECOMMENDATIONS - Mirapex, Requip, or Sinemet for treatment of Periodic Leg Movements of Sleep. - Avoid alcohol, sedatives and other CNS depressants that may worsen sleep apnea and disrupt normal sleep architecture. - Sleep hygiene should be reviewed to assess factors that may improve sleep quality. - Weight management and regular exercise should be initiated or continued if appropriate.  [Electronically signed] 05/03/2023 09:55 AM  Cyril Mourning MD, ABSM Diplomate, American Board of Sleep Medicine NPI: [id number]

## 2023-05-03 NOTE — Telephone Encounter (Signed)
Surprisingly no significant sleep apnea was noted during the study. He had a lot of leg movements which caused him to wake up during the study This may be consistent with restless legs syndrome Would suggest that we check blood iron levels since iron deficiency can be a cause -If he is willing, check iron/TIBC/ferritin -If this is normal we can consider treatment with medication such as Requip

## 2023-05-04 NOTE — Telephone Encounter (Signed)
I called the pt and there was no answer- LMTCB. ?

## 2023-05-05 NOTE — Telephone Encounter (Signed)
I called and spoke with the pt and notified of response per Dr Vassie Loll  Pt verbalized understanding  Lab order placed  Nothing further needed

## 2023-05-05 NOTE — Telephone Encounter (Signed)
Patient called answering service to get results of sleep study.

## 2023-05-10 ENCOUNTER — Ambulatory Visit (HOSPITAL_COMMUNITY)
Admission: RE | Admit: 2023-05-10 | Discharge: 2023-05-10 | Disposition: A | Discharge status: Home / Self Care | Payer: Medicaid Other | Attending: Internal Medicine | Admitting: Internal Medicine

## 2023-05-10 ENCOUNTER — Ambulatory Visit (HOSPITAL_COMMUNITY): Payer: Medicaid Other | Admitting: Anesthesiology

## 2023-05-10 ENCOUNTER — Ambulatory Visit (HOSPITAL_BASED_OUTPATIENT_CLINIC_OR_DEPARTMENT_OTHER): Payer: Medicaid Other | Admitting: Anesthesiology

## 2023-05-10 ENCOUNTER — Other Ambulatory Visit: Payer: Self-pay

## 2023-05-10 ENCOUNTER — Encounter (HOSPITAL_COMMUNITY)
Admission: RE | Disposition: A | Discharge status: Home / Self Care | Payer: Self-pay | Source: Home / Self Care | Attending: Internal Medicine

## 2023-05-10 ENCOUNTER — Encounter (HOSPITAL_COMMUNITY): Payer: Self-pay

## 2023-05-10 DIAGNOSIS — D122 Benign neoplasm of ascending colon: Secondary | ICD-10-CM | POA: Insufficient documentation

## 2023-05-10 DIAGNOSIS — F419 Anxiety disorder, unspecified: Secondary | ICD-10-CM | POA: Diagnosis not present

## 2023-05-10 DIAGNOSIS — Z09 Encounter for follow-up examination after completed treatment for conditions other than malignant neoplasm: Secondary | ICD-10-CM | POA: Insufficient documentation

## 2023-05-10 DIAGNOSIS — D128 Benign neoplasm of rectum: Secondary | ICD-10-CM

## 2023-05-10 DIAGNOSIS — K635 Polyp of colon: Secondary | ICD-10-CM | POA: Diagnosis not present

## 2023-05-10 DIAGNOSIS — D125 Benign neoplasm of sigmoid colon: Secondary | ICD-10-CM | POA: Diagnosis not present

## 2023-05-10 DIAGNOSIS — D120 Benign neoplasm of cecum: Secondary | ICD-10-CM | POA: Diagnosis not present

## 2023-05-10 DIAGNOSIS — D124 Benign neoplasm of descending colon: Secondary | ICD-10-CM | POA: Insufficient documentation

## 2023-05-10 DIAGNOSIS — Z1211 Encounter for screening for malignant neoplasm of colon: Secondary | ICD-10-CM | POA: Diagnosis not present

## 2023-05-10 DIAGNOSIS — D126 Benign neoplasm of colon, unspecified: Secondary | ICD-10-CM

## 2023-05-10 DIAGNOSIS — D123 Benign neoplasm of transverse colon: Secondary | ICD-10-CM | POA: Insufficient documentation

## 2023-05-10 DIAGNOSIS — Z7689 Persons encountering health services in other specified circumstances: Secondary | ICD-10-CM | POA: Diagnosis not present

## 2023-05-10 DIAGNOSIS — K648 Other hemorrhoids: Secondary | ICD-10-CM | POA: Insufficient documentation

## 2023-05-10 DIAGNOSIS — D127 Benign neoplasm of rectosigmoid junction: Secondary | ICD-10-CM | POA: Diagnosis not present

## 2023-05-10 DIAGNOSIS — Z87891 Personal history of nicotine dependence: Secondary | ICD-10-CM | POA: Insufficient documentation

## 2023-05-10 HISTORY — PX: HEMOSTASIS CLIP PLACEMENT: SHX6857

## 2023-05-10 HISTORY — DX: Cannabis use, unspecified, uncomplicated: F12.90

## 2023-05-10 HISTORY — PX: POLYPECTOMY: SHX5525

## 2023-05-10 HISTORY — PX: COLONOSCOPY WITH PROPOFOL: SHX5780

## 2023-05-10 HISTORY — DX: Antisocial personality disorder: F60.2

## 2023-05-10 SURGERY — COLONOSCOPY WITH PROPOFOL
Anesthesia: General

## 2023-05-10 MED ORDER — GLYCOPYRROLATE PF 0.2 MG/ML IJ SOSY
PREFILLED_SYRINGE | INTRAMUSCULAR | Status: AC
  Filled 2023-05-10: qty 1

## 2023-05-10 MED ORDER — PROPOFOL 10 MG/ML IV BOLUS
INTRAVENOUS | Status: AC
  Filled 2023-05-10: qty 40

## 2023-05-10 MED ORDER — LACTATED RINGERS IV SOLN: INTRAVENOUS | Status: DC

## 2023-05-10 MED ORDER — GLYCOPYRROLATE 0.2 MG/ML IJ SOLN
INTRAMUSCULAR | Status: DC | PRN
  Administered 2023-05-10: .1 mg via INTRAVENOUS

## 2023-05-10 MED ORDER — PROPOFOL 10 MG/ML IV BOLUS
INTRAVENOUS | Status: AC
  Filled 2023-05-10: qty 20

## 2023-05-10 MED ORDER — PROPOFOL 500 MG/50ML IV EMUL
INTRAVENOUS | Status: DC | PRN
  Administered 2023-05-10: 225 ug/kg/min via INTRAVENOUS
  Administered 2023-05-10: 200 ug/kg/min via INTRAVENOUS
  Administered 2023-05-10: 225 ug/kg/min via INTRAVENOUS
  Administered 2023-05-10: 200 ug/kg/min via INTRAVENOUS

## 2023-05-10 MED ORDER — PROPOFOL 10 MG/ML IV BOLUS
INTRAVENOUS | Status: DC | PRN
Start: 2023-05-10 — End: 2023-05-10
  Administered 2023-05-10: 50 mg via INTRAVENOUS
  Administered 2023-05-10: 100 mg via INTRAVENOUS

## 2023-05-10 NOTE — Op Note (Addendum)
[facility] Patient Name: Reginald Thompson Procedure Date: 05/10/2023 2:03 PM MRN: [id number] Date of Birth: 08/13/1976 Attending MD: Hennie Duos. Marletta Lor , [place], [id number] CSN: [id number] Age: 47 Admit Type: Outpatient Procedure:                Colonoscopy Indications:              Screening for colorectal malignant neoplasm Providers:                Hennie Duos. Marletta Lor, DO, Sheran Fava, Pandora Leiter, Technician Referring MD:              Medicines:                See the Anesthesia note for documentation of the                            administered medications Complications:            No immediate complications. Estimated Blood Loss:     Estimated blood loss was minimal. Procedure:                Pre-Anesthesia Assessment:                           - The anesthesia plan was to use monitored                            anesthesia care (MAC).                           After obtaining informed consent, the colonoscope                            was passed under direct vision. Throughout the                            procedure, the patient's blood pressure, pulse, and                            oxygen saturations were monitored continuously. The                            [id number]-HQ190L ([id number]) scope was introduced through                            the anus and advanced to the the cecum, identified                            by appendiceal orifice and ileocecal valve. The                            colonoscopy was performed without difficulty. The                            patient tolerated the procedure well. The  quality                            of the bowel preparation was evaluated using the                            BBPS [facility] Bowel Preparation Scale) with scores                            of: Right Colon = 2 (minor amount of residual                            staining, small fragments of stool and/or opaque                             liquid, but mucosa seen well), Transverse Colon = 2                            (minor amount of residual staining, small fragments                            of stool and/or opaque liquid, but mucosa seen                            well) and Left Colon = 2 (minor amount of residual                            staining, small fragments of stool and/or opaque                            liquid, but mucosa seen well). The total BBPS score                            equals 6. Fair. Scope In: 2:10:50 PM Scope Out: 3:04:17 PM Scope Withdrawal Time: 0 hours 51 minutes 3 seconds  Total Procedure Duration: 0 hours 53 minutes 27 seconds  Findings:      Non-bleeding internal hemorrhoids were found during endoscopy.      Four sessile polyps were found in the ascending colon and cecum. The       polyps were 4 to 8 mm in size. These polyps were removed with a cold       snare. Resection and retrieval were complete.      A 10 mm polyp was found in the descending colon. The polyp was       pedunculated. The polyp was removed with a cold snare. Resection and       retrieval were complete. For hemostasis, two hemostatic clips were       successfully placed (MR conditional). Clip manufacturer: [employer]. There was no bleeding at the end of the procedure.      Nine sessile polyps were found in the descending colon and transverse       colon. The polyps were 4 to 9 mm in size. These polyps were removed with       a cold snare.  Resection and retrieval were complete.      Three sessile polyps were found in the rectum and sigmoid colon. The       polyps were 4 to 6 mm in size. These polyps were removed with a cold       snare. Resection and retrieval were complete.      A 30 mm polyp was found in the sigmoid colon. The polyp was       pedunculated. The polyp was removed with a hot snare. Resection and       retrieval were complete. Polyp broke apart with roth net extraction. Impression:                - Non-bleeding internal hemorrhoids.                           - Four 4 to 8 mm polyps in the ascending colon and                            in the cecum, removed with a cold snare. Resected                            and retrieved.                           - One 10 mm polyp in the descending colon, removed                            with a cold snare. Resected and retrieved. Clips                            (MR conditional) were placed. Clip manufacturer:                            [employer].                           - Nine 4 to 9 mm polyps in the descending colon and                            in the transverse colon, removed with a cold snare.                            Resected and retrieved.                           - Three 4 to 6 mm polyps in the rectum and in the                            sigmoid colon, removed with a cold snare. Resected                            and retrieved.                           - One 30 mm polyp  in the sigmoid colon, removed                            with a hot snare. Resected and retrieved. Moderate Sedation:      Per Anesthesia Care Recommendation:           - Patient has a contact number available for                            emergencies. The signs and symptoms of potential                            delayed complications were discussed with the                            patient. Return to normal activities tomorrow.                            Written discharge instructions were provided to the                            patient.                           - Resume previous diet.                           - Continue present medications.                           - Await pathology results.                           - Repeat colonoscopy date to be determined after                            pending pathology results are reviewed for                            surveillance based on pathology results.                           - Return  to GI clinic PRN.                           - PATIENT WILL NEED KUB PRIOR TO ANY MRI DONE IN                            THE NEAR FUTURE DUE TO CLIPS PLACED TODAY Procedure Code(s):        --- Professional ---                           [id number], Colonoscopy, flexible; with removal of                            tumor(s), polyp(s), or other lesion(s)  by snare                            technique Diagnosis Code(s):        --- Professional ---                           Z12.11, Encounter for screening for malignant                            neoplasm of colon                           D12.2, Benign neoplasm of ascending colon                           D12.0, Benign neoplasm of cecum                           D12.4, Benign neoplasm of descending colon                           D12.3, Benign neoplasm of transverse colon (hepatic                            flexure or splenic flexure)                           D12.8, Benign neoplasm of rectum                           D12.5, Benign neoplasm of sigmoid colon                           K64.8, Other hemorrhoids CPT copyright 2022 American Medical Association. All rights reserved. The codes documented in this report are preliminary and upon coder review may  be revised to meet current compliance requirements. Hennie Duos. Marletta Lor, DO Hennie Duos. Marletta Lor, DO 05/10/2023 3:10:13 PM This report has been signed electronically. Number of Addenda: 0

## 2023-05-10 NOTE — Anesthesia Postprocedure Evaluation (Signed)
Anesthesia Post Note  Patient: Reginald Thompson  Procedure(s) Performed: COLONOSCOPY WITH PROPOFOL POLYPECTOMY HEMOSTASIS CLIP PLACEMENT  Patient location during evaluation: Phase II Anesthesia Type: General Level of consciousness: awake and alert and oriented Pain management: pain level controlled Vital Signs Assessment: post-procedure vital signs reviewed and stable Respiratory status: spontaneous breathing, nonlabored ventilation and respiratory function stable Cardiovascular status: blood pressure returned to baseline and stable Postop Assessment: no apparent nausea or vomiting Anesthetic complications: no  No notable events documented.   Last Vitals:  Vitals:   05/10/23 1209 05/10/23 1509  BP: 124/73 (!) 96/51  Pulse: (!) 53 (!) 45  Resp: 16 14  Temp: 36.8 C 36.4 C  SpO2: 96% 98%    Last Pain:  Vitals:   05/10/23 1513  TempSrc:   PainSc: 0-No pain                 Camdyn Laden C Clorinda Wyble

## 2023-05-10 NOTE — Progress Notes (Signed)
Patient stated he has an MR I  scheduled for 05/13/2023.  Informed patient and his girlfriend to let MRI technician know he has two clips placed in his colon from his procedure today.  Reiterated this twice with both patient and girlfriend and he does have the card with clip placement in his discharge information.

## 2023-05-10 NOTE — Discharge Instructions (Addendum)
  Colonoscopy Discharge Instructions  Read the instructions outlined below and refer to this sheet in the next few weeks. These discharge instructions provide you with general information on caring for yourself after you leave the hospital. Your doctor may also give you specific instructions. While your treatment has been planned according to the most current medical practices available, unavoidable complications occasionally occur.   ACTIVITY You may resume your regular activity, but move at a slower pace for the next 24 hours.  Take frequent rest periods for the next 24 hours.  Walking will help get rid of the air and reduce the bloated feeling in your belly (abdomen).  No driving for 24 hours (because of the medicine (anesthesia) used during the test).   Do not sign any important legal documents or operate any machinery for 24 hours (because of the anesthesia used during the test).  NUTRITION Drink plenty of fluids.  You may resume your normal diet as instructed by your doctor.  Begin with a light meal and progress to your normal diet. Heavy or fried foods are harder to digest and may make you feel sick to your stomach (nauseated).  Avoid alcoholic beverages for 24 hours or as instructed.  MEDICATIONS You may resume your normal medications unless your doctor tells you otherwise.  WHAT YOU CAN EXPECT TODAY Some feelings of bloating in the abdomen.  Passage of more gas than usual.  Spotting of blood in your stool or on the toilet paper.  IF YOU HAD POLYPS REMOVED DURING THE COLONOSCOPY: No aspirin products for 7 days or as instructed.  No alcohol for 7 days or as instructed.  Eat a soft diet for the next 24 hours.  FINDING OUT THE RESULTS OF YOUR TEST Not all test results are available during your visit. If your test results are not back during the visit, make an appointment with your caregiver to find out the results. Do not assume everything is normal if you have not heard from your  caregiver or the medical facility. It is important for you to follow up on all of your test results.  SEEK IMMEDIATE MEDICAL ATTENTION IF: You have more than a spotting of blood in your stool.  Your belly is swollen (abdominal distention).  You are nauseated or vomiting.  You have a temperature over 101.  You have abdominal pain or discomfort that is severe or gets worse throughout the day.   Your colonoscopy revealed 18 polyp(s) which I removed successfully. One was quite large.   One site required clips after polypectomy. These will naturally fall off over the next few months.  If you need an MRI for any reason in the near future you will need x-ray prior as these clips are metallic.  Await pathology results, my office will contact you. Repeat colonoscopy timing to be determined after path reviewed.   I hope you have a great rest of your week!  Hennie Duos. Marletta Lor, D.O. Gastroenterology and Hepatology [facility]

## 2023-05-10 NOTE — Transfer of Care (Signed)
Immediate Anesthesia Transfer of Care Note  Patient: Reginald Thompson  Procedure(s) Performed: COLONOSCOPY WITH PROPOFOL POLYPECTOMY HEMOSTASIS CLIP PLACEMENT  Patient Location: Endoscopy Unit  Anesthesia Type:General  Level of Consciousness: drowsy and patient cooperative  Airway & Oxygen Therapy: Patient Spontanous Breathing  Post-op Assessment: Report given to RN and Post -op Vital signs reviewed and stable  Post vital signs: Reviewed and stable  Last Vitals:  Vitals Value Taken Time  BP 96/51 05/10/23 1509  Temp 36.4 C 05/10/23 1509  Pulse 45 05/10/23 1509  Resp 14 05/10/23 1509  SpO2 98 % 05/10/23 1509    Last Pain:  Vitals:   05/10/23 1513  TempSrc:   PainSc: 0-No pain      Patients Stated Pain Goal: 10 (05/10/23 1209)  Complications: No notable events documented.

## 2023-05-10 NOTE — Anesthesia Preprocedure Evaluation (Signed)
Anesthesia Evaluation  Patient identified by MRN, date of birth, ID band Patient awake    Reviewed: Allergy & Precautions, H&P , NPO status , Patient's Chart, lab work & pertinent test results  Airway Mallampati: I  TM Distance: >3 FB Neck ROM: Full    Dental  (+) Missing, Chipped, Dental Advisory Given   Pulmonary former smoker   Pulmonary exam normal breath sounds clear to auscultation       Cardiovascular negative cardio ROS Normal cardiovascular exam Rhythm:Regular Rate:Normal     Neuro/Psych  PSYCHIATRIC DISORDERS Anxiety      Neuromuscular disease    GI/Hepatic negative GI ROS,,,(+)     substance abuse  marijuana use  Endo/Other  negative endocrine ROS    Renal/GU negative Renal ROS  negative genitourinary   Musculoskeletal negative musculoskeletal ROS (+)    Abdominal   Peds negative pediatric ROS (+)  Hematology negative hematology ROS (+)   Anesthesia Other Findings   Reproductive/Obstetrics negative OB ROS                             Anesthesia Physical Anesthesia Plan  ASA: 2  Anesthesia Plan: General   Post-op Pain Management: Minimal or no pain anticipated   Induction: Intravenous  PONV Risk Score and Plan: 1  Airway Management Planned: Nasal Cannula and Natural Airway  Additional Equipment:   Intra-op Plan:   Post-operative Plan:   Informed Consent: I have reviewed the patients History and Physical, chart, labs and discussed the procedure including the risks, benefits and alternatives for the proposed anesthesia with the patient or authorized representative who has indicated his/her understanding and acceptance.     Dental advisory given  Plan Discussed with: CRNA and Surgeon  Anesthesia Plan Comments:        Anesthesia Quick Evaluation

## 2023-05-12 LAB — SURGICAL PATHOLOGY

## 2023-05-13 ENCOUNTER — Ambulatory Visit (HOSPITAL_COMMUNITY)
Admission: RE | Admit: 2023-05-13 | Discharge: 2023-05-13 | Disposition: A | Discharge status: Home / Self Care | Payer: Medicaid Other | Source: Ambulatory Visit | Attending: Neurosurgery | Admitting: Neurosurgery

## 2023-05-13 ENCOUNTER — Encounter (HOSPITAL_COMMUNITY): Payer: Self-pay

## 2023-05-13 DIAGNOSIS — Z7689 Persons encountering health services in other specified circumstances: Secondary | ICD-10-CM | POA: Diagnosis not present

## 2023-05-13 DIAGNOSIS — M47816 Spondylosis without myelopathy or radiculopathy, lumbar region: Secondary | ICD-10-CM

## 2023-05-18 NOTE — H&P (Signed)
Primary Care Physician:  Billie Lade, MD Primary Gastroenterologist:  Dr. Marletta Lor  Pre-Procedure History & Physical: HPI:  Reginald Thompson is a 47 y.o. male is here for a colonoscopy for colon cancer screening purposes.  Patient denies any family history of colorectal cancer.  No melena or hematochezia.  No abdominal pain or unintentional weight loss.  No change in bowel habits.  Overall feels well from a GI standpoint.  Past Medical History:  Diagnosis Date   Antisocial personality disorder (HCC)    Cannabis use disorder    PTSD (post-traumatic stress disorder)     Past Surgical History:  Procedure Laterality Date   CLOSED REDUCTION METACARPAL WITH PERCUTANEOUS PINNING Right 10/13/2020   Procedure: CLOSED REDUCTION METACARPAL WITH PERCUTANEOUS PINNING;  Surgeon: Betha Loa, MD;  Location: [facility];  Service: Orthopedics;  Laterality: Right;   NO PAST SURGERIES      Prior to Admission medications   Medication Sig Start Date End Date Taking? Authorizing Provider  Cholecalciferol (VITAMIN D-3) 125 MCG (5000 UT) TABS Take by mouth.   Yes [provider]  atorvastatin (LIPITOR) 40 MG tablet Take 1 tablet (40 mg total) by mouth daily. Patient not taking: Reported on 05/05/2023 03/17/23   Billie Lade, MD    Allergies as of 04/05/2023 - Review Complete 03/18/2023  Allergen Reaction Noted   Penicillins Anaphylaxis and Rash 09/28/2016    Family History  Problem Relation Age of Onset   Hypertension Mother    Diabetes Other    Cancer Other     Social History   Socioeconomic History   Marital status: Single    Spouse name: Not on file   Number of children: Not on file   Years of education: Not on file   Highest education level: Not on file  Occupational History   Not on file  Tobacco Use   Smoking status: Former    Types: Cigarettes   Smokeless tobacco: Never  Vaping Use   Vaping status: Never Used  Substance and Sexual Activity    Alcohol use: No   Drug use: Yes    Types: Marijuana    Comment: last dose 05/10/23   Sexual activity: Not on file  Other Topics Concern   Not on file  Social History Narrative   Not on file   Social Determinants of Health   Financial Resource Strain: Not on file  Food Insecurity: Not on file  Transportation Needs: Not on file  Physical Activity: Not on file  Stress: Not on file  Social Connections: Not on file  Intimate Partner Violence: Not on file    Review of Systems: See HPI, otherwise negative ROS  Physical Exam: Vital signs in last 24 hours:     General:   Alert,  Well-developed, well-nourished, pleasant and cooperative in NAD Head:  Normocephalic and atraumatic. Eyes:  Sclera clear, no icterus.   Conjunctiva pink. Ears:  Normal auditory acuity. Nose:  No deformity, discharge,  or lesions. Msk:  Symmetrical without gross deformities. Normal posture. Extremities:  Without clubbing or edema. Neurologic:  Alert and  oriented x4;  grossly normal neurologically. Skin:  Intact without significant lesions or rashes. Psych:  Alert and cooperative. Normal mood and affect.  Impression/Plan: [name] is here for a colonoscopy to be performed for colon cancer screening purposes.  The risks of the procedure including infection, bleed, or perforation as well as benefits, limitations, alternatives and imponderables have been reviewed with the patient. Questions  have been answered. All parties agreeable.

## 2023-05-20 ENCOUNTER — Encounter (HOSPITAL_COMMUNITY): Payer: Self-pay | Admitting: Internal Medicine

## 2023-05-26 DIAGNOSIS — Z419 Encounter for procedure for purposes other than remedying health state, unspecified: Secondary | ICD-10-CM | POA: Diagnosis not present

## 2023-06-08 ENCOUNTER — Telehealth: Payer: Self-pay | Admitting: Internal Medicine

## 2023-06-08 NOTE — Telephone Encounter (Signed)
Patient is calling says he slipped and fell on his porch about a week ago and landed on his lower back that had already been bothering him. Says his butt bone has been killing him so bad he can barely sit in the car. He is supposed to have an MRI done but has yet to do so due to the clamps he had put in during his colonoscopy. Looking for advice on what to do- please advise. Thank you

## 2023-06-09 NOTE — Telephone Encounter (Signed)
Scheduled

## 2023-06-10 ENCOUNTER — Ambulatory Visit (HOSPITAL_COMMUNITY)
Admission: RE | Admit: 2023-06-10 | Discharge: 2023-06-10 | Disposition: A | Discharge status: Home / Self Care | Payer: Medicaid Other | Source: Ambulatory Visit | Attending: Internal Medicine | Admitting: Internal Medicine

## 2023-06-10 ENCOUNTER — Ambulatory Visit (INDEPENDENT_AMBULATORY_CARE_PROVIDER_SITE_OTHER): Payer: Medicaid Other | Admitting: Internal Medicine

## 2023-06-10 ENCOUNTER — Encounter: Payer: Self-pay | Admitting: Internal Medicine

## 2023-06-10 VITALS — BP 136/74 | HR 64 | Ht 72.0 in | Wt 278.8 lb

## 2023-06-10 DIAGNOSIS — M47816 Spondylosis without myelopathy or radiculopathy, lumbar region: Secondary | ICD-10-CM | POA: Diagnosis not present

## 2023-06-10 DIAGNOSIS — W19XXXA Unspecified fall, initial encounter: Secondary | ICD-10-CM | POA: Insufficient documentation

## 2023-06-10 DIAGNOSIS — M419 Scoliosis, unspecified: Secondary | ICD-10-CM | POA: Insufficient documentation

## 2023-06-10 DIAGNOSIS — M5136 Other intervertebral disc degeneration, lumbar region: Secondary | ICD-10-CM

## 2023-06-10 DIAGNOSIS — M545 Low back pain, unspecified: Secondary | ICD-10-CM | POA: Diagnosis not present

## 2023-06-10 DIAGNOSIS — R5381 Other malaise: Secondary | ICD-10-CM | POA: Diagnosis not present

## 2023-06-10 MED ORDER — KETOROLAC TROMETHAMINE 60 MG/2ML IM SOLN
60.0000 mg | Freq: Once | INTRAMUSCULAR | Status: AC
Start: 2023-06-10 — End: 2023-06-10
  Administered 2023-06-10: 60 mg via INTRAMUSCULAR

## 2023-06-10 MED ORDER — MELOXICAM 15 MG PO TABS: 15.0000 mg | ORAL_TABLET | Freq: Every day | ORAL | 0 refills | Status: DC

## 2023-06-10 MED ORDER — CYCLOBENZAPRINE HCL 5 MG PO TABS: 5.0000 mg | ORAL_TABLET | Freq: Two times a day (BID) | ORAL | 1 refills | Status: DC | PRN

## 2023-06-10 NOTE — Assessment & Plan Note (Signed)
His previous x-ray at spine surgeons office showed DDD of lumbar spine, needs follow-up MRI of lumbar spine in nonemergent setting Toradol IM today Started Mobic 15 mg ONCE DAILY Flexeril as needed for muscle spasms Avoid heavy lifting and frequent bending, should ideally avoid motorcycle riding as well Heating pad and/or back brace as needed
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
## 2023-06-10 NOTE — Patient Instructions (Addendum)
Please take Meloxicam as prescribed for back pain.  Avoid heavy lifting and frequent bending.  Okay to apply heating pad and/or back brace.
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
## 2023-06-10 NOTE — Assessment & Plan Note (Signed)
Likely mechanical fall Check an x-ray of lumbar spine to rule out acute fracture

## 2023-06-12 ENCOUNTER — Other Ambulatory Visit: Payer: Self-pay

## 2023-06-12 ENCOUNTER — Emergency Department (HOSPITAL_COMMUNITY): Payer: Medicaid Other

## 2023-06-12 ENCOUNTER — Encounter (HOSPITAL_COMMUNITY): Payer: Self-pay | Admitting: *Deleted

## 2023-06-12 ENCOUNTER — Emergency Department (HOSPITAL_COMMUNITY)
Admission: EM | Admit: 2023-06-12 | Discharge: 2023-06-12 | Disposition: A | Discharge status: Home / Self Care | Payer: Medicaid Other | Source: Home / Self Care | Attending: Emergency Medicine | Admitting: Emergency Medicine

## 2023-06-12 DIAGNOSIS — R079 Chest pain, unspecified: Secondary | ICD-10-CM | POA: Diagnosis not present

## 2023-06-12 DIAGNOSIS — R0789 Other chest pain: Secondary | ICD-10-CM | POA: Insufficient documentation

## 2023-06-12 LAB — BASIC METABOLIC PANEL
Anion gap: 10 (ref 5–15)
BUN: 14 mg/dL (ref 6–20)
CO2: 24 mmol/L (ref 22–32)
Calcium: 9.2 mg/dL (ref 8.9–10.3)
Chloride: 103 mmol/L (ref 98–111)
Creatinine, Ser: 1.09 mg/dL (ref 0.61–1.24)
GFR, Estimated: >60 mL/min (ref >60)
Glucose, Bld: 100 mg/dL — ABNORMAL HIGH (ref 70–99)
Potassium: 4 mmol/L (ref 3.5–5.1)
Sodium: 137 mmol/L (ref 135–145)

## 2023-06-12 LAB — CBC WITH DIFFERENTIAL/PLATELET
Abs Immature Granulocytes: 0.01 10*3/uL (ref 0.00–0.07)
Basophils Absolute: 0.1 10*3/uL (ref 0.0–0.1)
Basophils Relative: 1 %
Eosinophils Absolute: 0.1 10*3/uL (ref 0.0–0.5)
Eosinophils Relative: 2 %
HCT: 38.8 % — ABNORMAL LOW (ref 39.0–52.0)
Hemoglobin: 12.8 g/dL — ABNORMAL LOW (ref 13.0–17.0)
Immature Granulocytes: 0 %
Lymphocytes Relative: 35 %
Lymphs Abs: 2.5 10*3/uL (ref 0.7–4.0)
MCH: 30.5 pg (ref 26.0–34.0)
MCHC: 33 g/dL (ref 30.0–36.0)
MCV: 92.6 fL (ref 80.0–100.0)
Monocytes Absolute: 0.5 10*3/uL (ref 0.1–1.0)
Monocytes Relative: 7 %
Neutro Abs: 3.9 10*3/uL (ref 1.7–7.7)
Neutrophils Relative %: 55 %
Platelets: 273 10*3/uL (ref 150–400)
RBC: 4.19 MIL/uL — ABNORMAL LOW (ref 4.22–5.81)
RDW: 14.1 % (ref 11.5–15.5)
WBC: 7.1 10*3/uL (ref 4.0–10.5)
nRBC: 0 % (ref 0.0–0.2)

## 2023-06-12 LAB — MAGNESIUM: Magnesium: 2.1 mg/dL (ref 1.7–2.4)

## 2023-06-12 LAB — TROPONIN I (HIGH SENSITIVITY)
Troponin I (High Sensitivity): 7 ng/L (ref <18)
Troponin I (High Sensitivity): 7 ng/L (ref <18)

## 2023-06-12 MED ORDER — LACTATED RINGERS IV BOLUS
1000.0000 mL | Freq: Once | INTRAVENOUS | Status: AC
  Administered 2023-06-12: 1000 mL via INTRAVENOUS

## 2023-06-12 NOTE — ED Triage Notes (Signed)
Pt stated that he was recently prescribed flexeril and Meloxicam and has been taking them for 2 days, but is now experiencing tingling and pain down his left side. Pain is mostly in his left shoulder and arm.

## 2023-06-12 NOTE — ED Notes (Signed)
Patient transported to X-ray 

## 2023-06-12 NOTE — Discharge Instructions (Signed)
No concerning cause of your chest pain.  No evidence of heart attack.  You did feel improved after the fluids in the emergency department.  For any concerning symptoms return to the emergency department otherwise follow-up with your primary care provider.

## 2023-06-12 NOTE — ED Provider Notes (Signed)
[place] EMERGENCY DEPARTMENT AT [facility] Provider Note   CSN: [id number] Arrival date & time: 06/12/23  1050     History  Chief Complaint  Patient presents with   Chest Pain    Reginald Thompson is a 47 y.o. male.  47 year old male presents today for concern of chest pain.  He was recently seen for neck pain by his PCP and prescribed Mobic and Flexeril.  He states after he took Flexeril started feeling some tingling as well as chest tightness.  He denies any shortness of breath, diaphoresis, lightheadedness, or palpitations.  No prior history of CAD.  Denies any other complaints.  Attributes this to Flexeril.  He is bradycardic but states this is chronic and normal for him.  The history is provided by the patient. No language interpreter was used.       Home Medications Prior to Admission medications   Medication Sig Start Date End Date Taking? Authorizing Provider  atorvastatin (LIPITOR) 40 MG tablet Take 1 tablet (40 mg total) by mouth daily. Patient not taking: Reported on 05/05/2023 03/17/23   Billie Lade, MD  Cholecalciferol (VITAMIN D-3) 125 MCG (5000 UT) TABS Take by mouth.    [provider]  cyclobenzaprine (FLEXERIL) 5 MG tablet Take 1 tablet (5 mg total) by mouth 2 (two) times daily as needed. 06/10/23   Anabel Halon, MD  meloxicam (MOBIC) 15 MG tablet Take 1 tablet (15 mg total) by mouth daily. 06/10/23   Anabel Halon, MD      Allergies    Penicillins    Review of Systems   Review of Systems  Constitutional:  Negative for chills and fever.  Respiratory:  Negative for shortness of breath.   Cardiovascular:  Positive for chest pain. Negative for palpitations and leg swelling.  Neurological:  Negative for light-headedness.  All other systems reviewed and are negative.   Physical Exam Updated Vital Signs BP 129/73   Pulse (!) 47   Temp 98.9 F (37.2 C) (Oral)   Resp 13   Ht 6' (1.829 m)   Wt 126.5 kg   SpO2 96%   BMI 37.81  kg/m  Physical Exam Vitals and nursing note reviewed.  Constitutional:      General: He is not in acute distress.    Appearance: Normal appearance. He is not ill-appearing.  HENT:     Head: Normocephalic and atraumatic.     Nose: Nose normal.  Eyes:     General: No scleral icterus.    Extraocular Movements: Extraocular movements intact.     Conjunctiva/sclera: Conjunctivae normal.  Cardiovascular:     Rate and Rhythm: Normal rate and regular rhythm.     Heart sounds: Normal heart sounds.  Pulmonary:     Effort: Pulmonary effort is normal. No respiratory distress.     Breath sounds: Normal breath sounds. No wheezing or rales.  Abdominal:     General: There is no distension.     Tenderness: There is no abdominal tenderness.  Musculoskeletal:        General: Normal range of motion.     Cervical back: Normal range of motion.  Skin:    General: Skin is warm and dry.  Neurological:     General: No focal deficit present.     Mental Status: He is alert. Mental status is at baseline.     ED Results / Procedures / Treatments   Labs (all labs ordered are listed, but only abnormal results  are displayed) Labs Reviewed  BASIC METABOLIC PANEL - Abnormal; Notable for the following components:      Result Value   Glucose, Bld 100 (*)    All other components within normal limits  CBC WITH DIFFERENTIAL/PLATELET - Abnormal; Notable for the following components:   RBC 4.19 (*)    Hemoglobin 12.8 (*)    HCT 38.8 (*)    All other components within normal limits  MAGNESIUM  TROPONIN I (HIGH SENSITIVITY)  TROPONIN I (HIGH SENSITIVITY)    EKG None  Radiology DG Chest 2 View  Result Date: 06/12/2023 CLINICAL DATA:  Chest pain EXAM: CHEST - 2 VIEW COMPARISON:  Chest x-ray dated December 02, 2022 FINDINGS: The heart size and mediastinal contours are within normal limits. Both lungs are clear. The visualized skeletal structures are unremarkable. IMPRESSION: No active cardiopulmonary  disease. Electronically Signed   By: Allegra Lai M.D.   On: 06/12/2023 12:29    Procedures Procedures    Medications Ordered in ED Medications  lactated ringers bolus 1,000 mL (0 mLs Intravenous Stopped 06/12/23 1406)    ED Course/ Medical Decision Making/ A&P                                 Medical Decision Making Amount and/or Complexity of Data Reviewed Labs: ordered. Radiology: ordered.   47 year old male presents today for concern of chest tightness.  States symptoms started after he took his Flexeril dose.  Fluids given in the emergency department with improvement in symptoms.  ACS workup obtained which was overall reassuring.  Negative troponin, BMP with glucose of 100 otherwise without acute concerns.  CBC with hemoglobin of 12.8, without leukocytosis.  Magnesium 2.1.  Chest x-ray without acute cardiopulmonary process.  EKG without acute ischemic changes.  Symptoms ongoing since last night.  Given duration of symptoms do not feel second troponin is warranted however it is in process at the time of discharge.  Will follow-up to ensure this not significantly elevated.  Patient is otherwise stable for discharge.  Symptoms improved.   Final Clinical Impression(s) / ED Diagnoses Final diagnoses:  Atypical chest pain    Rx / DC Orders ED Discharge Orders     None         Marita Kansas, PA-C 06/12/23 1417    Sloan Leiter, DO 06/13/23 1257

## 2023-06-26 DIAGNOSIS — Z419 Encounter for procedure for purposes other than remedying health state, unspecified: Secondary | ICD-10-CM | POA: Diagnosis not present

## 2023-07-05 ENCOUNTER — Ambulatory Visit (HOSPITAL_COMMUNITY): Payer: Medicaid Other

## 2023-07-13 ENCOUNTER — Ambulatory Visit (HOSPITAL_COMMUNITY)
Admission: RE | Admit: 2023-07-13 | Discharge: 2023-07-13 | Disposition: A | Discharge status: Home / Self Care | Payer: Medicaid Other | Source: Ambulatory Visit | Attending: Neurosurgery | Admitting: Neurosurgery

## 2023-07-13 DIAGNOSIS — Z7689 Persons encountering health services in other specified circumstances: Secondary | ICD-10-CM | POA: Diagnosis not present

## 2023-07-13 DIAGNOSIS — M47816 Spondylosis without myelopathy or radiculopathy, lumbar region: Secondary | ICD-10-CM | POA: Insufficient documentation

## 2023-07-13 DIAGNOSIS — M48061 Spinal stenosis, lumbar region without neurogenic claudication: Secondary | ICD-10-CM | POA: Diagnosis not present

## 2023-07-13 DIAGNOSIS — M4807 Spinal stenosis, lumbosacral region: Secondary | ICD-10-CM | POA: Diagnosis not present

## 2023-07-13 DIAGNOSIS — M5127 Other intervertebral disc displacement, lumbosacral region: Secondary | ICD-10-CM | POA: Diagnosis not present

## 2023-07-13 DIAGNOSIS — M419 Scoliosis, unspecified: Secondary | ICD-10-CM | POA: Diagnosis not present

## 2023-07-19 ENCOUNTER — Telehealth: Payer: Self-pay | Admitting: Internal Medicine

## 2023-07-19 NOTE — Telephone Encounter (Signed)
Called in for MRI results

## 2023-07-20 NOTE — Telephone Encounter (Signed)
Spoke to patient

## 2023-07-26 DIAGNOSIS — Z419 Encounter for procedure for purposes other than remedying health state, unspecified: Secondary | ICD-10-CM | POA: Diagnosis not present

## 2023-08-08 DIAGNOSIS — M5416 Radiculopathy, lumbar region: Secondary | ICD-10-CM | POA: Diagnosis not present

## 2023-08-16 DIAGNOSIS — Z7689 Persons encountering health services in other specified circumstances: Secondary | ICD-10-CM | POA: Diagnosis not present

## 2023-08-26 DIAGNOSIS — Z419 Encounter for procedure for purposes other than remedying health state, unspecified: Secondary | ICD-10-CM | POA: Diagnosis not present

## 2023-09-13 DIAGNOSIS — M5416 Radiculopathy, lumbar region: Secondary | ICD-10-CM | POA: Diagnosis not present

## 2023-09-19 ENCOUNTER — Ambulatory Visit (INDEPENDENT_AMBULATORY_CARE_PROVIDER_SITE_OTHER): Payer: Medicaid Other | Admitting: Internal Medicine

## 2023-09-19 ENCOUNTER — Encounter: Payer: Self-pay | Admitting: Internal Medicine

## 2023-09-19 VITALS — BP 120/72 | HR 61 | Ht 72.0 in | Wt 278.8 lb

## 2023-09-19 DIAGNOSIS — G8929 Other chronic pain: Secondary | ICD-10-CM

## 2023-09-19 DIAGNOSIS — E782 Mixed hyperlipidemia: Secondary | ICD-10-CM

## 2023-09-19 DIAGNOSIS — M5441 Lumbago with sciatica, right side: Secondary | ICD-10-CM

## 2023-09-19 DIAGNOSIS — M5442 Lumbago with sciatica, left side: Secondary | ICD-10-CM

## 2023-09-19 NOTE — Progress Notes (Signed)
Established Patient Office Visit  Subjective   Patient ID: Reginald Thompson, male    DOB: 03-Jun-1976  Age: 47 y.o. MRN: [id number]  Chief Complaint  Patient presents with   Back Pain    Back pain received two shots last week to help with his back ,patient still in pain can not get out of the bed, walking with a cane   Mr. Reginald Thompson returns to care today for routine follow-up.  He was last evaluated by me on 5/23.  At that time he endorsed persistent lumbar back pain that had worsened with physical therapy.  He was referred to neurosurgery for further evaluation and management.  Atorvastatin 40 mg daily was also started in the setting of hyperlipidemia.  In the interim, he underwent colonoscopy in July.  Multiple polyps were removed.  These were tubular adenomas, one with high-grade dysplasia.  He will need repeat colonoscopy in one year.  He presented to [facility] for an acute visit on 8/16 in the setting of a fall with acute on chronic lumbar back pain.  ED presentation on 8/18 endorsing chest pain.  He has established care with neurosurgery and was last seen on 11/19 and received bilateral L2-3 transforaminal ESI's.   Today Mr. Reginald Thompson endorses persistent lumbar back pain.  He states that symptoms improved for 3 days after receiving steroid injections on 11/19, but quickly returned.  No red flag symptoms identified today.  He does not have any additional concerns to discuss.  Past Medical History:  Diagnosis Date   Antisocial personality disorder (HCC)    Cannabis use disorder    PTSD (post-traumatic stress disorder)    Past Surgical History:  Procedure Laterality Date   CLOSED REDUCTION METACARPAL WITH PERCUTANEOUS PINNING Right 10/13/2020   Procedure: CLOSED REDUCTION METACARPAL WITH PERCUTANEOUS PINNING;  Surgeon: Betha Loa, MD;  Location: [facility];  Service: Orthopedics;  Laterality: Right;   COLONOSCOPY WITH PROPOFOL N/A 05/10/2023   Procedure: COLONOSCOPY WITH PROPOFOL;   Surgeon: Lanelle Bal, DO;  [facility]: [facility];  Service: Endoscopy;  Laterality: N/A;  12:30 PM, ASA 2   HEMOSTASIS CLIP PLACEMENT  05/10/2023   Procedure: HEMOSTASIS CLIP PLACEMENT;  Surgeon: Lanelle Bal, DO;  [facility];  Service: Endoscopy;;   NO PAST SURGERIES     POLYPECTOMY  05/10/2023   Procedure: POLYPECTOMY;  Surgeon: Lanelle Bal, DO;  [facility]: [facility];  Service: Endoscopy;;   Social History   Tobacco Use   Smoking status: Former    Types: Cigarettes   Smokeless tobacco: Never  Vaping Use   Vaping status: Never Used  Substance Use Topics   Alcohol use: No   Drug use: Yes    Types: Marijuana    Comment: last dose 05/10/23   Family History  Problem Relation Age of Onset   Hypertension Mother    Diabetes Other    Cancer Other    Allergies  Allergen Reactions   Penicillins Anaphylaxis and Rash    Has patient had a PCN reaction causing immediate rash, facial/tongue/throat swelling, SOB or lightheadedness with hypotension: Yes Has patient had a PCN reaction causing severe rash involving mucus membranes or skin necrosis: Yes Has patient had a PCN reaction that required hospitalization Yes Has patient had a PCN reaction occurring within the last 10 years: No If all of the above answers are "NO", then may proceed with Cephalosporin use.    Review of Systems  Musculoskeletal:  Positive for back  pain (Chronic lumbar back pain with bilateral radiculopathy (R>L)).     Objective:     BP 120/72 (BP Location: Left Arm, Patient Position: Sitting, Cuff Size: Large)   Pulse 61   Ht 6' (1.829 m)   Wt 278 lb 12.8 oz (126.5 kg)   SpO2 94%   BMI 37.81 kg/m  BP Readings from Last 3 Encounters:  09/19/23 120/72  06/12/23 129/73  06/10/23 136/74   Physical Exam Vitals reviewed.  Constitutional:      General: He is not in acute distress.    Appearance: Normal appearance. He is obese. He is not ill-appearing.  HENT:     Head:  Normocephalic and atraumatic.     Right Ear: External ear normal.     Left Ear: External ear normal.     Nose: Nose normal. No congestion or rhinorrhea.     Mouth/Throat:     Mouth: Mucous membranes are moist.     Pharynx: Oropharynx is clear.  Eyes:     General: No scleral icterus.    Extraocular Movements: Extraocular movements intact.     Conjunctiva/sclera: Conjunctivae normal.     Pupils: Pupils are equal, round, and reactive to light.  Cardiovascular:     Rate and Rhythm: Normal rate and regular rhythm.     Pulses: Normal pulses.     Heart sounds: Normal heart sounds. No murmur heard. Pulmonary:     Effort: Pulmonary effort is normal.     Breath sounds: Normal breath sounds. No wheezing, rhonchi or rales.  Abdominal:     General: Abdomen is flat. Bowel sounds are normal. There is no distension.     Palpations: Abdomen is soft.     Tenderness: There is no abdominal tenderness.  Musculoskeletal:        General: No swelling or deformity. Normal range of motion.     Cervical back: Normal range of motion.  Skin:    General: Skin is warm and dry.     Capillary Refill: Capillary refill takes less than 2 seconds.  Neurological:     General: No focal deficit present.     Mental Status: He is alert and oriented to person, place, and time.     Motor: No weakness.     Gait: Gait abnormal (Ambulates with walking stick).  Psychiatric:        Mood and Affect: Mood normal.        Behavior: Behavior normal.        Thought Content: Thought content normal.   Last CBC Lab Results  Component Value Date   WBC 7.1 06/12/2023   HGB 12.8 (L) 06/12/2023   HCT 38.8 (L) 06/12/2023   MCV 92.6 06/12/2023   MCH 30.5 06/12/2023   RDW 14.1 06/12/2023   PLT 273 06/12/2023   Last metabolic panel Lab Results  Component Value Date   GLUCOSE 100 (H) 06/12/2023   NA 137 06/12/2023   K 4.0 06/12/2023   CL 103 06/12/2023   CO2 24 06/12/2023   BUN 14 06/12/2023   CREATININE 1.09 06/12/2023    GFRNONAA >60 06/12/2023   CALCIUM 9.2 06/12/2023   PROT 7.3 12/09/2022   ALBUMIN 4.6 12/09/2022   LABGLOB 2.7 12/09/2022   AGRATIO 1.7 12/09/2022   BILITOT 0.4 12/09/2022   ALKPHOS 96 12/09/2022   AST 22 12/09/2022   ALT 41 12/09/2022   ANIONGAP 10 06/12/2023   Last lipids Lab Results  Component Value Date   CHOL 280 (H) 12/09/2022  HDL 39 (L) 12/09/2022   LDLCALC 215 (H) 12/09/2022   TRIG 138 12/09/2022   CHOLHDL 7.2 (H) 12/09/2022   Last hemoglobin A1c Lab Results  Component Value Date   HGBA1C 6.0 (H) 12/09/2022   Last thyroid functions Lab Results  Component Value Date   TSH 1.620 12/09/2022   Last vitamin D Lab Results  Component Value Date   VD25OH 23.4 (L) 12/09/2022   Last vitamin B12 and Folate Lab Results  Component Value Date   VITAMINB12 592 12/09/2022   FOLATE 18.2 12/09/2022   The 10-year ASCVD risk score (Arnett DK, et al., 2019) is: 5.5%    Assessment & Plan:   Problem List Items Addressed This Visit       Chronic lumbar pain - Primary    He has established care with neurosurgery (Dr. Conchita Paris) and recently received bilateral L2-3 transforaminal ESI's on 11/19.  Pain improved x 3 days but quickly returned.  He feels that pain is poorly controlled currently.  He continues to endorse radicular symptoms in both legs, worse on the right leg.  No red flag symptoms identified on exam today. -He will contact neurosurgery to arrange follow-up to discuss worsening pain.      Hyperlipidemia    Lipid panel updated in February.  Total cholesterol 280 and LDL 215.  Atorvastatin 40 mg daily was started in light of this result but today he reports not taking it due to concern that it will impact his heart. -I reviewed the mechanism of action of atorvastatin, specifically how it does not act directly on his heart, but lowers his cholesterol in an effort to reduce cardiovascular events.  Through shared decision making, he will resume atorvastatin 40 mg daily  as previously prescribed.  Repeat labs at follow-up in 3 months.       Return in about 3 months (around 12/20/2023).    Billie Lade, MD

## 2023-09-19 NOTE — Assessment & Plan Note (Signed)
He has established care with neurosurgery (Dr. Conchita Paris) and recently received bilateral L2-3 transforaminal ESI's on 11/19.  Pain improved x 3 days but quickly returned.  He feels that pain is poorly controlled currently.  He continues to endorse radicular symptoms in both legs, worse on the right leg.  No red flag symptoms identified on exam today. -He will contact neurosurgery to arrange follow-up to discuss worsening pain.

## 2023-09-19 NOTE — Patient Instructions (Signed)
It was a pleasure to see you today.  Thank you for giving [place] the opportunity to be involved in your care.  Below is a brief recap of your visit and next steps.  We will plan to see you again in 3 months.  Summary No medication changes today Resume atorvastatin I recommend calling your neurosurgeon to arrange follow up  Follow up in 3 months

## 2023-09-19 NOTE — Assessment & Plan Note (Signed)
Lipid panel updated in February.  Total cholesterol 280 and LDL 215.  Atorvastatin 40 mg daily was started in light of this result but today he reports not taking it due to concern that it will impact his heart. -I reviewed the mechanism of action of atorvastatin, specifically how it does not act directly on his heart, but lowers his cholesterol in an effort to reduce cardiovascular events.  Through shared decision making, he will resume atorvastatin 40 mg daily as previously prescribed.  Repeat labs at follow-up in 3 months.

## 2023-09-25 DIAGNOSIS — Z419 Encounter for procedure for purposes other than remedying health state, unspecified: Secondary | ICD-10-CM | POA: Diagnosis not present

## 2023-10-26 DIAGNOSIS — Z419 Encounter for procedure for purposes other than remedying health state, unspecified: Secondary | ICD-10-CM | POA: Diagnosis not present

## 2023-11-26 DIAGNOSIS — Z419 Encounter for procedure for purposes other than remedying health state, unspecified: Secondary | ICD-10-CM | POA: Diagnosis not present

## 2023-12-20 ENCOUNTER — Ambulatory Visit: Payer: Medicaid Other | Admitting: Internal Medicine

## 2023-12-24 DIAGNOSIS — Z419 Encounter for procedure for purposes other than remedying health state, unspecified: Secondary | ICD-10-CM | POA: Diagnosis not present

## 2024-01-18 ENCOUNTER — Encounter: Payer: Self-pay | Admitting: Internal Medicine

## 2024-01-18 ENCOUNTER — Ambulatory Visit: Payer: Self-pay | Admitting: Internal Medicine

## 2024-01-18 VITALS — BP 99/64 | HR 57 | Ht 72.0 in | Wt 282.0 lb

## 2024-01-18 DIAGNOSIS — L989 Disorder of the skin and subcutaneous tissue, unspecified: Secondary | ICD-10-CM | POA: Insufficient documentation

## 2024-01-18 DIAGNOSIS — H538 Other visual disturbances: Secondary | ICD-10-CM | POA: Insufficient documentation

## 2024-01-18 DIAGNOSIS — E782 Mixed hyperlipidemia: Secondary | ICD-10-CM

## 2024-01-18 DIAGNOSIS — E669 Obesity, unspecified: Secondary | ICD-10-CM

## 2024-01-18 DIAGNOSIS — R7303 Prediabetes: Secondary | ICD-10-CM

## 2024-01-18 DIAGNOSIS — M51362 Other intervertebral disc degeneration, lumbar region with discogenic back pain and lower extremity pain: Secondary | ICD-10-CM | POA: Diagnosis not present

## 2024-01-18 DIAGNOSIS — E559 Vitamin D deficiency, unspecified: Secondary | ICD-10-CM

## 2024-01-18 MED ORDER — CYCLOBENZAPRINE HCL 5 MG PO TABS
5.0000 mg | ORAL_TABLET | Freq: Two times a day (BID) | ORAL | 1 refills | Status: AC | PRN
Start: 2024-01-18 — End: ?

## 2024-01-18 NOTE — Patient Instructions (Signed)
 It was a pleasure to see you today.  Thank you for giving [place] the opportunity to be involved in your care.  Below is a brief recap of your visit and next steps.  We will plan to see you again in 6 months.   Summary No medication changes today Refills provided Repeat labs Dermatology and eye doctor referrals placed Follow up in 6 months

## 2024-01-18 NOTE — Assessment & Plan Note (Signed)
 Ophthalmology referral placed today patient's request

## 2024-01-18 NOTE — Progress Notes (Signed)
 Established Patient Office Visit  Subjective   Patient ID: Reginald Thompson, male    DOB: [birth year]  Age: 48 y.o. MRN: [id number]  Chief Complaint  Patient presents with   Care Management    Three month follow up    Referral    Referral to an eye doctor    Knee Pain    Right knee pain from a fall six months ago    Mr. Reginald Thompson returns today for routine follow-up.  He was last evaluated by me in November 2024.  His acute concern at that time was chronic lumbar back pain.  He endorsed radicular symptoms in both legs, worse on the right.  No red flag symptoms identified.  I recommended following up with neurosurgery to discuss worsening pain.  Atorvastatin 40 mg daily was also prescribed in the setting of hyperlipidemia.  60-month follow-up was arranged for reassessment.  There have been no acute interval events. Mr. Reginald Thompson reports feeling fairly well today.  He endorses right knee pain from a fall roughly 6 months ago.  Pain is gradually improving.  His acute concerns today requesting referral to ophthalmology for visual assessment as his vision is gradually deteriorating.  His girlfriend is also concerned about a lesion on the right side of his scalp that has been present for many years.  Past Medical History:  Diagnosis Date   Antisocial personality disorder (HCC)    Cannabis use disorder    PTSD (post-traumatic stress disorder)    Past Surgical History:  Procedure Laterality Date   CLOSED REDUCTION METACARPAL WITH PERCUTANEOUS PINNING Right 10/13/2020   Procedure: CLOSED REDUCTION METACARPAL WITH PERCUTANEOUS PINNING;  Surgeon: Betha Loa, MD;  Location: [facility];  Service: Orthopedics;  Laterality: Right;   COLONOSCOPY WITH PROPOFOL N/A 05/10/2023   Procedure: COLONOSCOPY WITH PROPOFOL;  Surgeon: Lanelle Bal, DO;  Location: [facility];  Service: Endoscopy;  Laterality: N/A;  12:30 PM, ASA 2   HEMOSTASIS CLIP PLACEMENT  05/10/2023   Procedure: HEMOSTASIS CLIP  PLACEMENT;  Surgeon: Lanelle Bal, DO;  Location: [facility];  Service: Endoscopy;;   NO PAST SURGERIES     POLYPECTOMY  05/10/2023   Procedure: POLYPECTOMY;  Surgeon: Lanelle Bal, DO;  Location: [facility];  Service: Endoscopy;;   Social History   Tobacco Use   Smoking status: Former    Types: Cigarettes   Smokeless tobacco: Never  Vaping Use   Vaping status: Never Used  Substance Use Topics   Alcohol use: No   Drug use: Yes    Types: Marijuana    Comment: last dose 05/10/23   Family History  Problem Relation Age of Onset   Hypertension Mother    Diabetes Other    Cancer Other    Allergies  Allergen Reactions   Penicillins Anaphylaxis and Rash    Has patient had a PCN reaction causing immediate rash, facial/tongue/throat swelling, SOB or lightheadedness with hypotension: Yes Has patient had a PCN reaction causing severe rash involving mucus membranes or skin necrosis: Yes Has patient had a PCN reaction that required hospitalization Yes Has patient had a PCN reaction occurring within the last 10 years: No If all of the above answers are "NO", then may proceed with Cephalosporin use.    Review of Systems  Eyes:  Positive for blurred vision.  Musculoskeletal:  Positive for back pain (Chronic lumbar back pain) and joint pain (Right knee pain x 6 months).  Skin:  Right posterior scalp lesion  All other systems reviewed and are negative.     Objective:     BP 99/64 (BP Location: Left Arm, Patient Position: Sitting, Cuff Size: Large)   Pulse (!) 57   Ht 6' (1.829 m)   Wt 282 lb (127.9 kg)   SpO2 94%   BMI 38.25 kg/m  BP Readings from Last 3 Encounters:  01/18/24 99/64  09/19/23 120/72  06/12/23 129/73   Physical Exam Vitals reviewed.  Constitutional:      General: He is not in acute distress.    Appearance: Normal appearance. He is obese. He is not ill-appearing.  HENT:     Head: Normocephalic and atraumatic.     Right Ear: External ear  normal.     Left Ear: External ear normal.     Nose: Nose normal. No congestion or rhinorrhea.     Mouth/Throat:     Mouth: Mucous membranes are moist.     Pharynx: Oropharynx is clear.  Eyes:     General: No scleral icterus.    Extraocular Movements: Extraocular movements intact.     Conjunctiva/sclera: Conjunctivae normal.     Pupils: Pupils are equal, round, and reactive to light.  Cardiovascular:     Rate and Rhythm: Normal rate and regular rhythm.     Pulses: Normal pulses.     Heart sounds: Normal heart sounds. No murmur heard. Pulmonary:     Effort: Pulmonary effort is normal.     Breath sounds: Normal breath sounds. No wheezing, rhonchi or rales.  Abdominal:     General: Abdomen is flat. Bowel sounds are normal. There is no distension.     Palpations: Abdomen is soft.     Tenderness: There is no abdominal tenderness.  Musculoskeletal:        General: No swelling or deformity. Normal range of motion.     Cervical back: Normal range of motion.  Skin:    General: Skin is warm and dry.     Capillary Refill: Capillary refill takes less than 2 seconds.     Findings: Lesion (right posterior scalp lesion, firm, non-tender, no overlying erythema or purulent) present.  Neurological:     General: No focal deficit present.     Mental Status: He is alert and oriented to person, place, and time.     Motor: No weakness.     Gait: Gait abnormal (Ambulates with walking stick).  Psychiatric:        Mood and Affect: Mood normal.        Behavior: Behavior normal.        Thought Content: Thought content normal.   Last CBC Lab Results  Component Value Date   WBC 7.1 06/12/2023   HGB 12.8 (L) 06/12/2023   HCT 38.8 (L) 06/12/2023   MCV 92.6 06/12/2023   MCH 30.5 06/12/2023   RDW 14.1 06/12/2023   PLT 273 06/12/2023   Last metabolic panel Lab Results  Component Value Date   GLUCOSE 100 (H) 06/12/2023   NA 137 06/12/2023   K 4.0 06/12/2023   CL 103 06/12/2023   CO2 24  06/12/2023   BUN 14 06/12/2023   CREATININE 1.09 06/12/2023   GFRNONAA >60 06/12/2023   CALCIUM 9.2 06/12/2023   PROT 7.3 12/09/2022   ALBUMIN 4.6 12/09/2022   LABGLOB 2.7 12/09/2022   AGRATIO 1.7 12/09/2022   BILITOT 0.4 12/09/2022   ALKPHOS 96 12/09/2022   AST 22 12/09/2022   ALT 41 12/09/2022  ANIONGAP 10 06/12/2023   Last lipids Lab Results  Component Value Date   CHOL 280 (H) 12/09/2022   HDL 39 (L) 12/09/2022   LDLCALC 215 (H) 12/09/2022   TRIG 138 12/09/2022   CHOLHDL 7.2 (H) 12/09/2022   Last hemoglobin A1c Lab Results  Component Value Date   HGBA1C 6.0 (H) 12/09/2022   Last thyroid functions Lab Results  Component Value Date   TSH 1.620 12/09/2022   Last vitamin D Lab Results  Component Value Date   VD25OH 23.4 (L) 12/09/2022   Last vitamin B12 and Folate Lab Results  Component Value Date   VITAMINB12 592 12/09/2022   FOLATE 18.2 12/09/2022   The 10-year ASCVD risk score (Arnett DK, et al., 2019) is: 4.3%    Assessment & Plan:   Problem List Items Addressed This Visit       Skin lesion of scalp   His acute concern today is a firm, nontender lesion in the right posterior scalp that has been present for many years.  His girlfriend is concerned that it is enlarging in size and would like to have it evaluated, potentially excised.  This likely represents a cyst vs lipoma.  Dermatology referral placed at patient's request.      Prediabetes - Primary   A1c 6.0 on labs from February 2024.  Repeat A1c ordered today.      Vitamin D insufficiency   Noted on previous labs.  Repeat vitamin D level ordered today.      Hyperlipidemia   Lipid panel last updated in February 2024.  Total cholesterol 280 and LDL 215.  His atorvastatin 40 mg daily was resumed in November after he previously stopped taking it due to concern that it would negatively impact his heart.  He has not experienced any adverse side effects.  Repeat lipid panel ordered today.       Blurred vision, bilateral   Ophthalmology referral placed today patient's request      Return in about 6 months (around 07/20/2024).   Billie Lade, MD

## 2024-01-18 NOTE — Assessment & Plan Note (Signed)
 A1c 6.0 on labs from February 2024.  Repeat A1c ordered today.

## 2024-01-18 NOTE — Assessment & Plan Note (Signed)
 Lipid panel last updated in February 2024.  Total cholesterol 280 and LDL 215.  His atorvastatin 40 mg daily was resumed in November after he previously stopped taking it due to concern that it would negatively impact his heart.  He has not experienced any adverse side effects.  Repeat lipid panel ordered today.

## 2024-01-18 NOTE — Assessment & Plan Note (Signed)
 His acute concern today is a firm, nontender lesion in the right posterior scalp that has been present for many years.  His girlfriend is concerned that it is enlarging in size and would like to have it evaluated, potentially excised.  This likely represents a cyst vs lipoma.  Dermatology referral placed at patient's request.

## 2024-01-18 NOTE — Assessment & Plan Note (Signed)
 Noted on previous labs.  Repeat vitamin D level ordered today

## 2024-01-19 LAB — CBC WITH DIFFERENTIAL/PLATELET

## 2024-01-20 LAB — VITAMIN D 25 HYDROXY (VIT D DEFICIENCY, FRACTURES): Vit D, 25-Hydroxy: 46.3 ng/mL (ref 30.0–100.0)

## 2024-01-20 LAB — CMP14+EGFR
ALT: 40 IU/L (ref 0–44)
AST: 26 IU/L (ref 0–40)
Albumin: 4.5 g/dL (ref 4.1–5.1)
Alkaline Phosphatase: 94 IU/L (ref 44–121)
BUN/Creatinine Ratio: 15 (ref 9–20)
BUN: 18 mg/dL (ref 6–24)
Bilirubin Total: <0.2 mg/dL (ref 0.0–1.2)
CO2: 22 mmol/L (ref 20–29)
Calcium: 9.7 mg/dL (ref 8.7–10.2)
Chloride: 104 mmol/L (ref 96–106)
Creatinine, Ser: 1.21 mg/dL (ref 0.76–1.27)
Globulin, Total: 2.3 g/dL (ref 1.5–4.5)
Glucose: 76 mg/dL (ref 70–99)
Potassium: 4.7 mmol/L (ref 3.5–5.2)
Sodium: 140 mmol/L (ref 134–144)
Total Protein: 6.8 g/dL (ref 6.0–8.5)
eGFR: 73 mL/min/{1.73_m2} (ref >59)

## 2024-01-20 LAB — LIPID PANEL
Chol/HDL Ratio: 3.4 ratio (ref 0.0–5.0)
Cholesterol, Total: 124 mg/dL (ref 100–199)
HDL: 36 mg/dL — ABNORMAL LOW (ref >39)
LDL Chol Calc (NIH): 63 mg/dL (ref 0–99)
Triglycerides: 141 mg/dL (ref 0–149)
VLDL Cholesterol Cal: 25 mg/dL (ref 5–40)

## 2024-01-20 LAB — B12 AND FOLATE PANEL
Folate: 11.9 ng/mL (ref >3.0)
Vitamin B-12: 545 pg/mL (ref 232–1245)

## 2024-01-20 LAB — CBC WITH DIFFERENTIAL/PLATELET
Basos: 1 %
EOS (ABSOLUTE): 0.1 10*3/uL (ref 0.0–0.2)
Eos: 2 %
Hematocrit: 41.4 % (ref 37.5–51.0)
Hemoglobin: 13.8 g/dL (ref 13.0–17.7)
Immature Granulocytes: 0.1 10*3/uL (ref 0.0–0.1)
Immature Granulocytes: 1 %
Lymphs: 27 %
MCH: 31.2 pg (ref 26.6–33.0)
MCHC: 33.3 g/dL (ref 31.5–35.7)
MCV: 94 fL (ref 79–97)
Monocytes Absolute: 0.1 10*3/uL (ref 0.0–0.4)
Monocytes Absolute: 0.6 10*3/uL (ref 0.1–0.9)
Monocytes: 7 %
Neutrophils Absolute: 2.3 10*3/uL (ref 0.7–3.1)
Neutrophils Absolute: 5.2 10*3/uL (ref 1.4–7.0)
Neutrophils: 62 %
Platelets: 264 10*3/uL (ref 150–450)
RBC: 4.43 x10E6/uL (ref 4.14–5.80)
RDW: 13.7 % (ref 11.6–15.4)
WBC: 8.4 10*3/uL (ref 3.4–10.8)

## 2024-01-20 LAB — TSH+FREE T4
Free T4: 1.08 ng/dL (ref 0.82–1.77)
TSH: 1.52 u[IU]/mL (ref 0.450–4.500)

## 2024-01-20 LAB — HEMOGLOBIN A1C
Est. average glucose Bld gHb Est-mCnc: 128 mg/dL
Hgb A1c MFr Bld: 6.1 % — ABNORMAL HIGH (ref 4.8–5.6)

## 2024-02-04 DIAGNOSIS — Z419 Encounter for procedure for purposes other than remedying health state, unspecified: Secondary | ICD-10-CM | POA: Diagnosis not present

## 2024-03-05 DIAGNOSIS — Z419 Encounter for procedure for purposes other than remedying health state, unspecified: Secondary | ICD-10-CM | POA: Diagnosis not present

## 2024-03-29 ENCOUNTER — Encounter (INDEPENDENT_AMBULATORY_CARE_PROVIDER_SITE_OTHER): Payer: Self-pay | Admitting: *Deleted

## 2024-04-05 DIAGNOSIS — Z419 Encounter for procedure for purposes other than remedying health state, unspecified: Secondary | ICD-10-CM | POA: Diagnosis not present

## 2024-05-05 DIAGNOSIS — Z419 Encounter for procedure for purposes other than remedying health state, unspecified: Secondary | ICD-10-CM | POA: Diagnosis not present

## 2024-06-05 DIAGNOSIS — Z419 Encounter for procedure for purposes other than remedying health state, unspecified: Secondary | ICD-10-CM | POA: Diagnosis not present

## 2024-07-06 DIAGNOSIS — Z419 Encounter for procedure for purposes other than remedying health state, unspecified: Secondary | ICD-10-CM | POA: Diagnosis not present

## 2024-07-20 ENCOUNTER — Ambulatory Visit

## 2024-07-20 VITALS — BP 111/69 | HR 51 | Ht 72.0 in | Wt 276.1 lb

## 2024-07-20 DIAGNOSIS — R220 Localized swelling, mass and lump, head: Secondary | ICD-10-CM

## 2024-07-20 DIAGNOSIS — E782 Mixed hyperlipidemia: Secondary | ICD-10-CM | POA: Diagnosis not present

## 2024-07-20 DIAGNOSIS — R7303 Prediabetes: Secondary | ICD-10-CM

## 2024-07-20 DIAGNOSIS — E559 Vitamin D deficiency, unspecified: Secondary | ICD-10-CM

## 2024-07-20 DIAGNOSIS — Z860100 Personal history of colon polyps, unspecified: Secondary | ICD-10-CM

## 2024-07-20 DIAGNOSIS — E669 Obesity, unspecified: Secondary | ICD-10-CM | POA: Insufficient documentation
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
## 2024-07-21 LAB — BASIC METABOLIC PANEL WITH GFR
BUN/Creatinine Ratio: 17 (ref 9–20)
BUN: 19 mg/dL (ref 6–24)
CO2: 20 mmol/L (ref 20–29)
Calcium: 9.6 mg/dL (ref 8.7–10.2)
Chloride: 103 mmol/L (ref 96–106)
Creatinine, Ser: 1.09 mg/dL (ref 0.76–1.27)
Glucose: 88 mg/dL (ref 70–99)
Potassium: 4.3 mmol/L (ref 3.5–5.2)
Sodium: 137 mmol/L (ref 134–144)
eGFR: 83 mL/min/1.73 (ref >59)

## 2024-07-21 LAB — HEMOGLOBIN A1C
Est. average glucose Bld gHb Est-mCnc: 128 mg/dL
Hgb A1c MFr Bld: 6.1 % — ABNORMAL HIGH (ref 4.8–5.6)

## 2024-07-21 LAB — VITAMIN D 25 HYDROXY (VIT D DEFICIENCY, FRACTURES): Vit D, 25-Hydroxy: 40.4 ng/mL (ref 30.0–100.0)

## 2024-07-22 NOTE — Assessment & Plan Note (Signed)
 Prediabetes with dietary challenges. - Advise reduction of carbohydrate intake, including bread, pasta, and sweets.

## 2024-07-22 NOTE — Assessment & Plan Note (Signed)
 Mixed hyperlipidemia managed with atorvastatin .

## 2024-08-05 DIAGNOSIS — Z419 Encounter for procedure for purposes other than remedying health state, unspecified: Secondary | ICD-10-CM | POA: Diagnosis not present

## 2024-09-05 DIAGNOSIS — Z419 Encounter for procedure for purposes other than remedying health state, unspecified: Secondary | ICD-10-CM | POA: Diagnosis not present

## 2024-10-26 ENCOUNTER — Ambulatory Visit: Payer: Self-pay
